# Patient Record
Sex: Female | Born: 1950 | Race: White | Hispanic: Yes | Marital: Married | Smoking: Never smoker
Health system: Southern US, Community
[De-identification: ages and names within clinical notes are randomized; demographics above are authoritative.]

## PROBLEM LIST (undated history)

## (undated) DIAGNOSIS — E785 Hyperlipidemia, unspecified: Secondary | ICD-10-CM

## (undated) DIAGNOSIS — I10 Essential (primary) hypertension: Secondary | ICD-10-CM

## (undated) DIAGNOSIS — E119 Type 2 diabetes mellitus without complications: Secondary | ICD-10-CM

## (undated) HISTORY — DX: Hyperlipidemia, unspecified: E78.5

## (undated) HISTORY — DX: Type 2 diabetes mellitus without complications: E11.9

## (undated) HISTORY — DX: Essential (primary) hypertension: I10

---

## 2001-12-09 ENCOUNTER — Ambulatory Visit (HOSPITAL_COMMUNITY): Admission: RE | Admit: 2001-12-09 | Discharge: 2001-12-09 | Payer: Self-pay | Admitting: Family Medicine

## 2001-12-09 ENCOUNTER — Encounter: Payer: Self-pay | Admitting: Family Medicine

## 2002-08-08 ENCOUNTER — Ambulatory Visit (HOSPITAL_COMMUNITY): Admission: RE | Admit: 2002-08-08 | Discharge: 2002-08-08 | Payer: Self-pay | Admitting: Pediatrics

## 2002-08-08 ENCOUNTER — Encounter: Payer: Self-pay | Admitting: General Surgery

## 2003-02-17 ENCOUNTER — Ambulatory Visit (HOSPITAL_COMMUNITY): Admission: RE | Admit: 2003-02-17 | Discharge: 2003-02-17 | Payer: Self-pay | Admitting: Family Medicine

## 2003-04-11 ENCOUNTER — Other Ambulatory Visit: Admission: RE | Admit: 2003-04-11 | Discharge: 2003-04-11 | Payer: Self-pay

## 2003-04-14 ENCOUNTER — Ambulatory Visit (HOSPITAL_COMMUNITY): Admission: RE | Admit: 2003-04-14 | Discharge: 2003-04-14 | Payer: Self-pay | Admitting: Family Medicine

## 2004-06-27 ENCOUNTER — Ambulatory Visit (HOSPITAL_COMMUNITY): Admission: RE | Admit: 2004-06-27 | Discharge: 2004-06-27 | Payer: Self-pay | Admitting: Family Medicine

## 2004-10-01 ENCOUNTER — Ambulatory Visit (HOSPITAL_COMMUNITY): Admission: RE | Admit: 2004-10-01 | Discharge: 2004-10-01 | Payer: Self-pay | Admitting: General Surgery

## 2004-10-04 ENCOUNTER — Ambulatory Visit (HOSPITAL_COMMUNITY): Admission: RE | Admit: 2004-10-04 | Discharge: 2004-10-04 | Payer: Self-pay | Admitting: General Surgery

## 2005-09-19 ENCOUNTER — Ambulatory Visit (HOSPITAL_COMMUNITY): Admission: RE | Admit: 2005-09-19 | Discharge: 2005-09-19 | Payer: Self-pay | Admitting: Family Medicine

## 2006-10-12 ENCOUNTER — Ambulatory Visit (HOSPITAL_COMMUNITY): Admission: RE | Admit: 2006-10-12 | Discharge: 2006-10-12 | Payer: Self-pay | Admitting: General Surgery

## 2007-10-21 ENCOUNTER — Ambulatory Visit (HOSPITAL_COMMUNITY): Admission: RE | Admit: 2007-10-21 | Discharge: 2007-10-21 | Payer: Self-pay | Admitting: General Surgery

## 2008-04-14 ENCOUNTER — Other Ambulatory Visit: Admission: RE | Admit: 2008-04-14 | Discharge: 2008-04-14 | Payer: Self-pay | Admitting: Obstetrics and Gynecology

## 2013-03-11 ENCOUNTER — Ambulatory Visit (HOSPITAL_COMMUNITY)
Admission: RE | Admit: 2013-03-11 | Discharge: 2013-03-11 | Disposition: A | Discharge status: Home / Self Care | Payer: No Typology Code available for payment source | Source: Ambulatory Visit | Attending: General Surgery | Admitting: General Surgery

## 2013-03-11 ENCOUNTER — Other Ambulatory Visit (HOSPITAL_COMMUNITY): Payer: Self-pay | Admitting: General Surgery

## 2013-03-11 DIAGNOSIS — M542 Cervicalgia: Secondary | ICD-10-CM

## 2013-08-29 ENCOUNTER — Other Ambulatory Visit (HOSPITAL_COMMUNITY): Payer: Self-pay | Admitting: General Surgery

## 2013-08-29 DIAGNOSIS — N644 Mastodynia: Secondary | ICD-10-CM

## 2013-08-31 ENCOUNTER — Ambulatory Visit (HOSPITAL_COMMUNITY)
Admission: RE | Admit: 2013-08-31 | Discharge: 2013-08-31 | Disposition: A | Discharge status: Home / Self Care | Payer: 59 | Source: Ambulatory Visit | Attending: General Surgery | Admitting: General Surgery

## 2013-08-31 DIAGNOSIS — N644 Mastodynia: Secondary | ICD-10-CM

## 2018-05-20 ENCOUNTER — Other Ambulatory Visit (HOSPITAL_COMMUNITY): Payer: Self-pay | Admitting: Family Medicine

## 2018-05-20 DIAGNOSIS — Z1231 Encounter for screening mammogram for malignant neoplasm of breast: Secondary | ICD-10-CM

## 2018-05-24 ENCOUNTER — Encounter (HOSPITAL_COMMUNITY): Payer: Self-pay

## 2018-05-24 ENCOUNTER — Ambulatory Visit (HOSPITAL_COMMUNITY)
Admission: RE | Admit: 2018-05-24 | Discharge: 2018-05-24 | Disposition: A | Discharge status: Home / Self Care | Payer: BLUE CROSS/BLUE SHIELD | Source: Ambulatory Visit | Attending: Family Medicine | Admitting: Family Medicine

## 2018-05-24 DIAGNOSIS — Z1231 Encounter for screening mammogram for malignant neoplasm of breast: Secondary | ICD-10-CM

## 2018-06-09 ENCOUNTER — Other Ambulatory Visit: Payer: Self-pay

## 2018-06-09 ENCOUNTER — Ambulatory Visit: Payer: BLUE CROSS/BLUE SHIELD | Admitting: Obstetrics and Gynecology

## 2018-06-09 ENCOUNTER — Other Ambulatory Visit (HOSPITAL_COMMUNITY)
Admission: RE | Admit: 2018-06-09 | Discharge: 2018-06-09 | Disposition: A | Discharge status: Home / Self Care | Payer: BLUE CROSS/BLUE SHIELD | Source: Ambulatory Visit | Attending: Obstetrics and Gynecology | Admitting: Obstetrics and Gynecology

## 2018-06-09 ENCOUNTER — Encounter: Payer: Self-pay | Admitting: Obstetrics and Gynecology

## 2018-06-09 VITALS — BP 144/71 | HR 75 | Ht 59.0 in | Wt 139.0 lb

## 2018-06-09 DIAGNOSIS — Z01419 Encounter for gynecological examination (general) (routine) without abnormal findings: Secondary | ICD-10-CM | POA: Insufficient documentation

## 2018-06-09 MED ORDER — HYDROCORTISONE 1 % EX OINT: 1.0000 "application " | TOPICAL_OINTMENT | Freq: Two times a day (BID) | CUTANEOUS | 99 refills | Status: DC

## 2018-06-09 MED ORDER — ESTROGENS, CONJUGATED 0.625 MG/GM VA CREA: 1.0000 | TOPICAL_CREAM | VAGINAL | 2 refills | Status: DC

## 2018-06-09 NOTE — Progress Notes (Signed)
Patient ID: Carrie Robertson, female   DOB: 07/17/1950, 67 y.o.   MRN: [id number]  Assessment:  Annual Gyn Exam Atrophic vaginitis Plan:  1. pap smear done, next pap due 3 years 2. Rx Premarin, 2-3 times a week at night/ Hydrocortisone cream 3. Gyn: f/u 7 weeks 3    Annual mammogram advised after age 67 Subjective:  Carrie Robertson is a 67 y.o. female G1P1 who presents for annual exam. No LMP recorded. Patient is postmenopausal. The patient has complaints today of vaginal dryness and itching. She has no other complaints.  The following portions of the patient's history were reviewed and updated as appropriate: allergies, current medications, past family history, past medical history, past social history, past surgical history and problem list. Past Medical History:  Diagnosis Date  . Hypertension     Past Surgical History:  Procedure Laterality Date  . CESAREAN SECTION      No current outpatient medications on file.  Review of Systems Constitutional: negative Gastrointestinal: negative Genitourinary: normal  Objective:  BP (!) 144/71 (BP Location: Right Arm, Patient Position: Sitting, Cuff Size: Normal)   Pulse 75   Ht 4\' 11"  (1.499 m)   Wt 139 lb (63 kg)   BMI 28.07 kg/m    BMI: Body mass index is 28.07 kg/m.  General Appearance: Alert, appropriate appearance for age. No acute distress HEENT: Grossly normal Neck / Thyroid:  Cardiovascular: RRR; normal S1, S2, no murmur Lungs: CTA bilaterally Back: No CVAT Breast Exam: Normal to inspection, Had 3D mammo on 05/24/18 with normal results Gastrointestinal: Soft, non-tender, no masses or organomegaly Pelvic Exam:   EXTERNAL GENITALIA: Moderate irritation and inflammation around clitoris VAGINA: severely atrophic tissues, good vaginal support CERVIX: tiny normal appearing  UTERUS:normal PAP: Pap smear done today. Lymphatic Exam: Non-palpable nodes in neck, clavicular, axillary, or inguinal regions  Skin:  no rash or abnormalities Neurologic: Normal gait and speech, no tremor  Psychiatric: Alert and oriented, appropriate affect.  Urinalysis:Not done  By signing my name below, I, Arnette NorrisMari Johnson, attest that this documentation has been prepared under the direction and in the presence of Tilda BurrowFerguson, Jabrea Kallstrom V, MD. Electronically Signed: Arnette NorrisMari Johnson Medical Scribe. 06/09/18. 8:59 AM.  I personally performed the services described in this documentation, which was SCRIBED in my presence. The recorded information has been reviewed and considered accurate. It has been edited as necessary during review. Tilda BurrowJohn V Keryl Gholson, MD

## 2018-06-09 NOTE — Progress Notes (Signed)
Spanish interpreter [phone] used for interpretation with medical history.   Pt c/o vaginal dryness. Premarin vaginal cream sample given per Dr Emelda FearFerguson Lot [id number] Exp 09/20

## 2018-06-11 LAB — CYTOLOGY - PAP
DIAGNOSIS: NEGATIVE
HPV: NOT DETECTED

## 2018-06-15 ENCOUNTER — Ambulatory Visit (HOSPITAL_COMMUNITY)
Admission: RE | Admit: 2018-06-15 | Discharge: 2018-06-15 | Disposition: A | Discharge status: Home / Self Care | Payer: BLUE CROSS/BLUE SHIELD | Source: Ambulatory Visit | Attending: Family Medicine | Admitting: Family Medicine

## 2018-06-15 ENCOUNTER — Other Ambulatory Visit (HOSPITAL_COMMUNITY): Payer: Self-pay | Admitting: Family Medicine

## 2018-06-15 DIAGNOSIS — M5440 Lumbago with sciatica, unspecified side: Secondary | ICD-10-CM | POA: Diagnosis present

## 2018-07-21 ENCOUNTER — Ambulatory Visit: Payer: BLUE CROSS/BLUE SHIELD | Admitting: Obstetrics and Gynecology

## 2018-09-14 ENCOUNTER — Other Ambulatory Visit (HOSPITAL_COMMUNITY): Payer: Self-pay | Admitting: Family Medicine

## 2018-09-14 DIAGNOSIS — M545 Low back pain, unspecified: Secondary | ICD-10-CM

## 2018-09-17 ENCOUNTER — Ambulatory Visit (HOSPITAL_COMMUNITY)
Admission: RE | Admit: 2018-09-17 | Discharge: 2018-09-17 | Disposition: A | Discharge status: Home / Self Care | Payer: BLUE CROSS/BLUE SHIELD | Source: Ambulatory Visit | Attending: Family Medicine | Admitting: Family Medicine

## 2018-09-17 ENCOUNTER — Other Ambulatory Visit: Payer: Self-pay

## 2018-09-17 DIAGNOSIS — M545 Low back pain, unspecified: Secondary | ICD-10-CM

## 2019-10-21 ENCOUNTER — Encounter: Payer: Self-pay | Admitting: *Deleted

## 2019-12-15 ENCOUNTER — Telehealth: Payer: Self-pay

## 2019-12-15 NOTE — Telephone Encounter (Signed)
Pt has a nurse visit on 01/19/20 and needs to cancel it. Pt is going out of town and will call back to r/s when she returns. Pt isn't sure when she will be back.

## 2019-12-15 NOTE — Telephone Encounter (Signed)
Noted.  Cancelled nurse visit.

## 2020-01-19 ENCOUNTER — Ambulatory Visit: Payer: BLUE CROSS/BLUE SHIELD

## 2020-11-01 IMAGING — MR MRI LUMBAR SPINE WITHOUT CONTRAST
4 of 5 series · 15 of 48 positions shown · non-contrast
Comparison: Lumbar radiography 06/15/2018

CLINICAL DATA: Mid back pain with numbness and tingling mainly on
the right side. Symptoms for 9 years

EXAM:
MRI LUMBAR SPINE WITHOUT CONTRAST
TECHNIQUE: Multiplanar, multisequence MR imaging of the lumbar spine was
performed. No intravenous contrast was administered.

[Series 5: T2 · sagittal · 4.0mm · 0.78mm/px · 6 of 17 slices shown (1 of 2)]
[im 1/17]
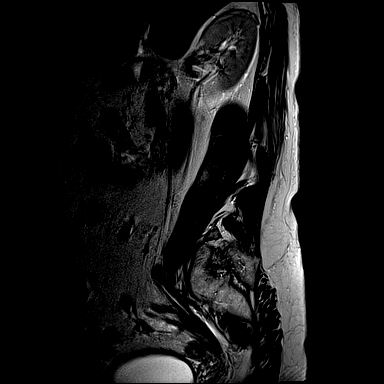
[im 4/17]
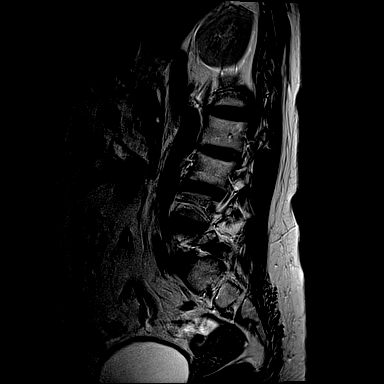
[im 7/17]
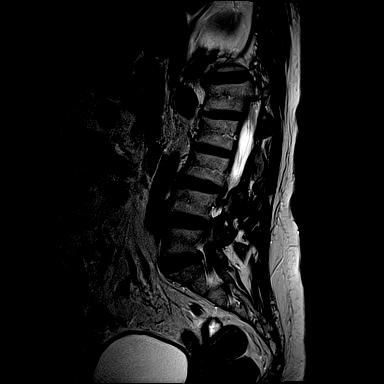
[im 10/17]
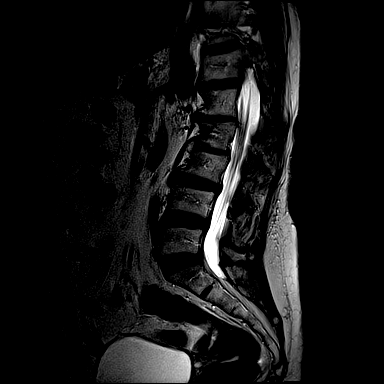
[im 13/17]
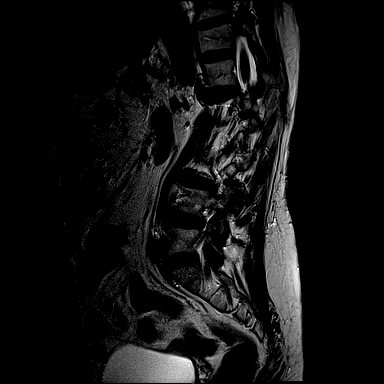
[im 17/17]
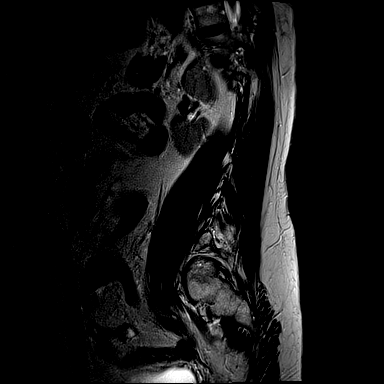

[Series 6: T1 · sagittal · 4.0mm · 0.39mm/px · 3 of 17 slices shown (1 of 2)]
[im 4/17]
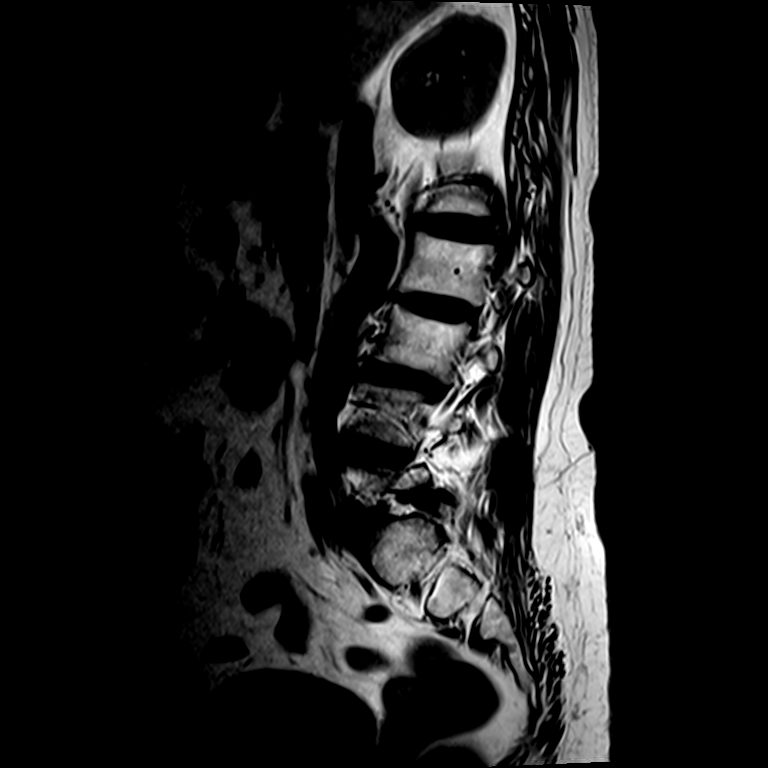
[im 10/17]
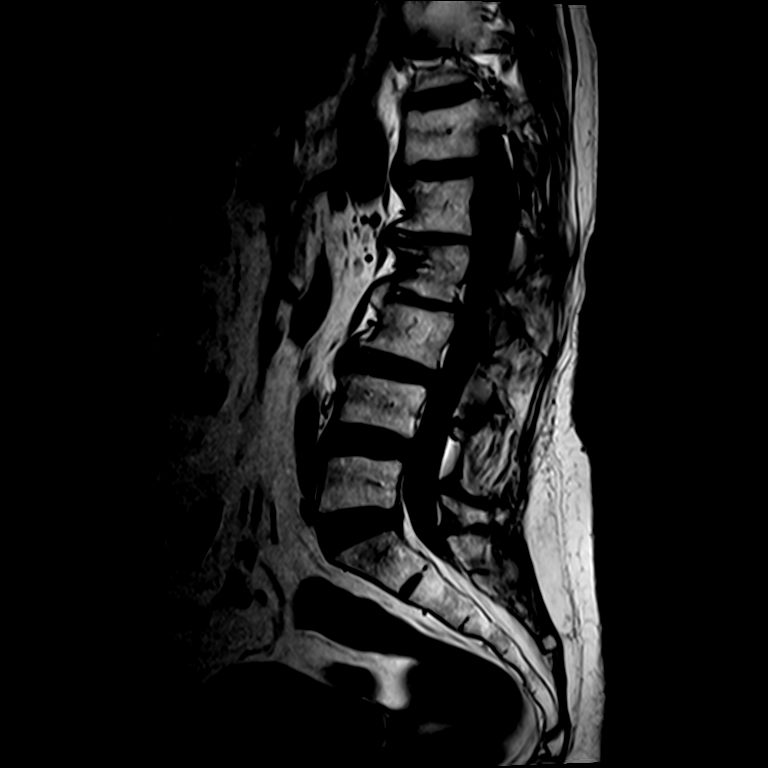
[im 17/17]
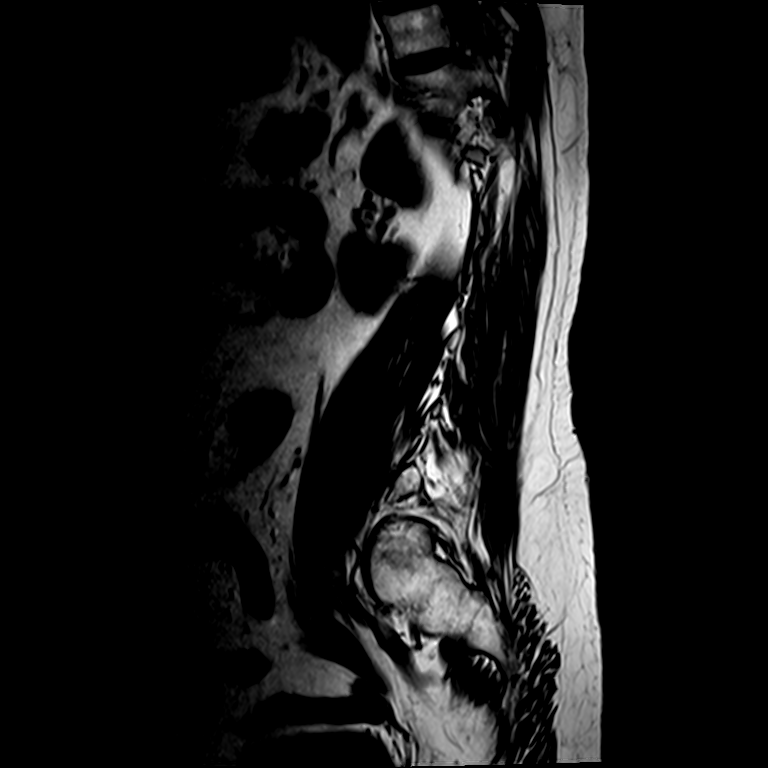

[Series 8: T2 · axial · 4.0mm · 0.26mm/px · z∈[-68,+66]mm · 3 of 42 slices shown (2 of 2)]
[im 6/42]
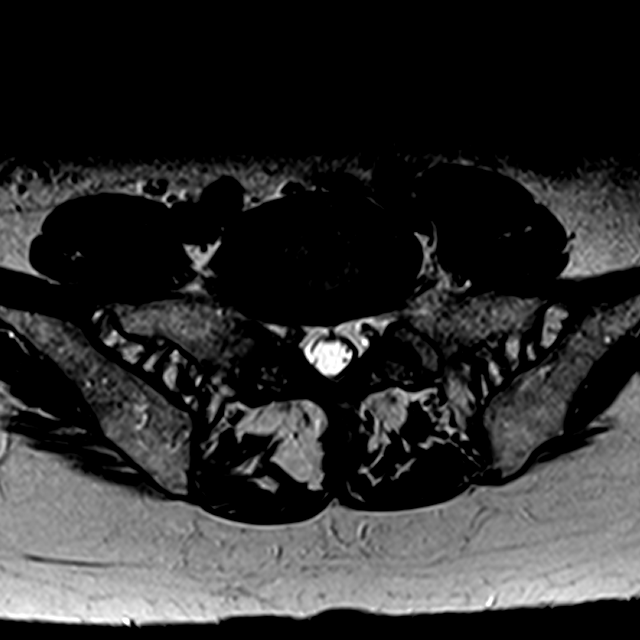
[im 21/42]
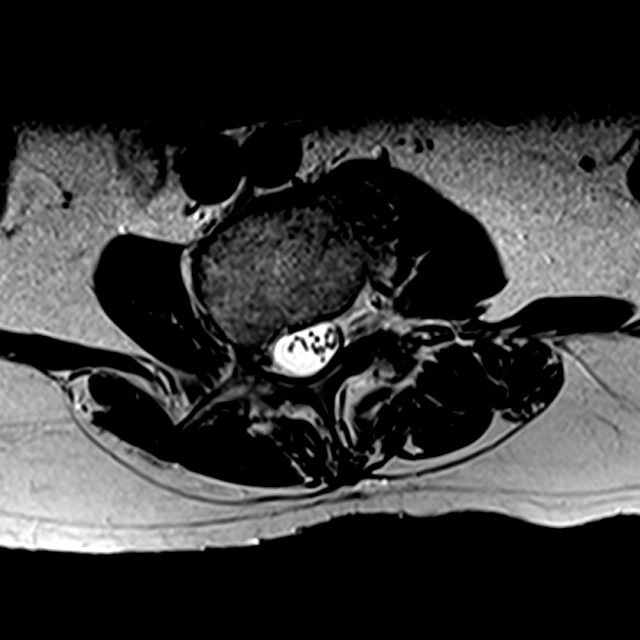
[im 36/42]
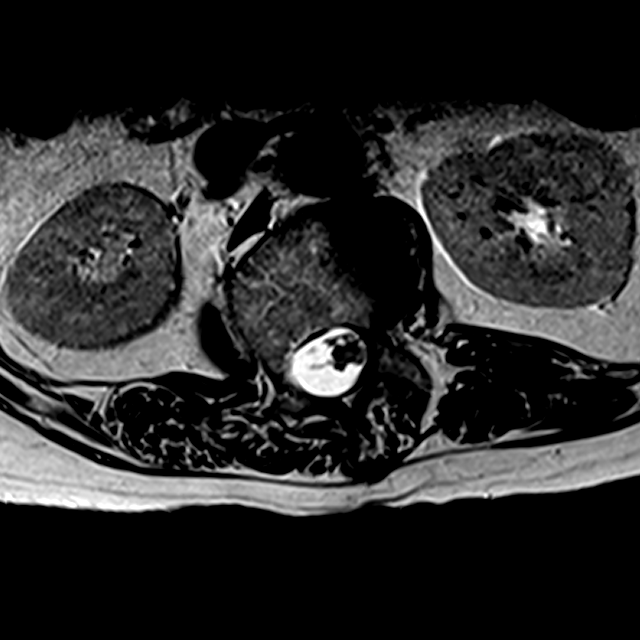

[Series 9: T1 · axial · 4.0mm · 0.26mm/px · z∈[-68,+66]mm · 3 of 42 slices shown (2 of 2)]
[im 6/42]
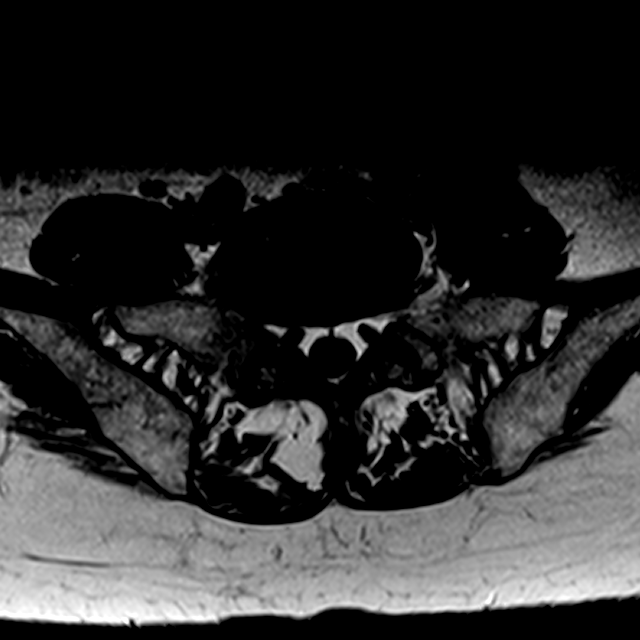
[im 21/42]
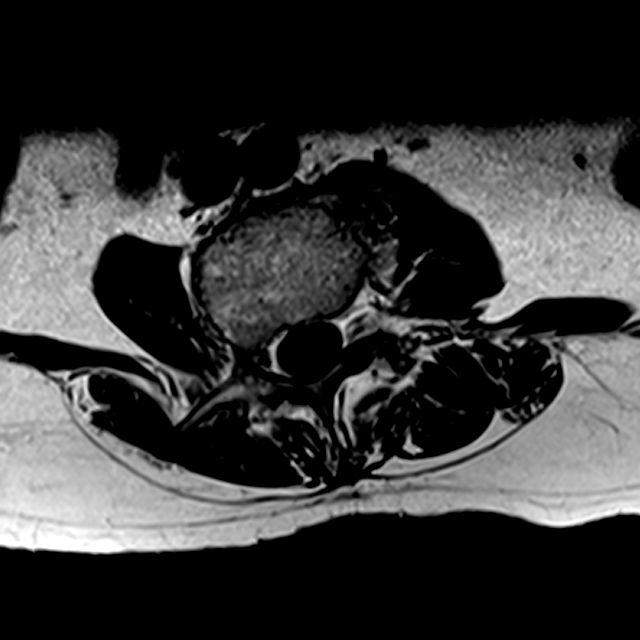
[im 36/42]
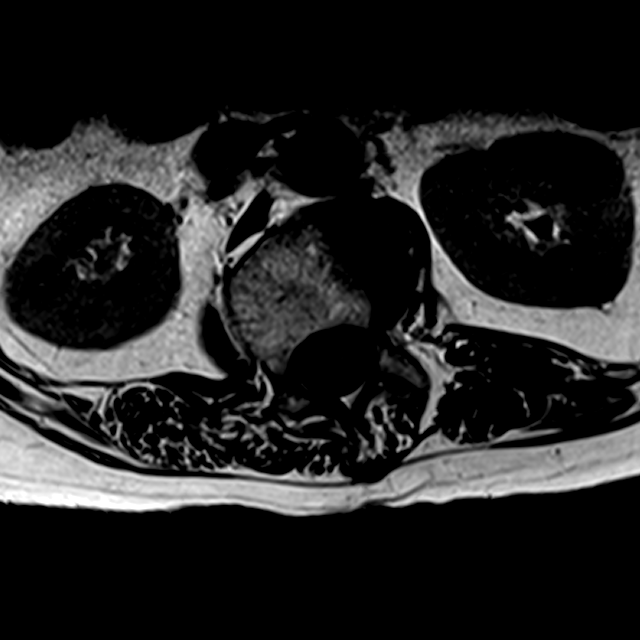

[15 of 48 positions shown; findings below may reference images not displayed]

FINDINGS: Segmentation:  5 lumbar vertebrae based on lumbar radiography

Alignment:  Dextroscoliosis centered at L1-2

Vertebrae:  No fracture, evidence of discitis, or bone lesion.

Conus medullaris and cauda equina: Conus extends to the L1-2 level.
Conus and cauda equina appear normal.

Paraspinal and other soft tissues: Negative

Disc levels:

T12- L1: Minor disc bulging

L1-L2: Asymmetric leftward disc narrowing.  No impingement

L2-L3: Asymmetric leftward disc narrowing.  No impingement

L3-L4: Mild disc bulging and facet spurring.  No impingement

L4-L5: Disc bulging with right subarticular recess stenosis that
could affect the L5 nerve root. Mild foraminal narrowing on the
right

L5-S1:Rightward disc bulging and mild facet spurring. Right
foraminal narrowing that is mild to moderate.
IMPRESSION: 1. Scoliosis with asymmetric disc narrowing and bulging.
2. L4-5 right subarticular recess stenosis that could affect the L5
nerve root.
3. L5-S1 mild borderline moderate right foraminal narrowing.

## 2022-12-10 ENCOUNTER — Ambulatory Visit: Payer: Medicare Other | Admitting: Family Medicine

## 2022-12-10 ENCOUNTER — Encounter: Payer: Self-pay | Admitting: Family Medicine

## 2022-12-10 ENCOUNTER — Encounter: Payer: Self-pay | Admitting: *Deleted

## 2022-12-10 VITALS — BP 174/94 | HR 74 | Ht 61.0 in | Wt 144.0 lb

## 2022-12-10 DIAGNOSIS — Z1329 Encounter for screening for other suspected endocrine disorder: Secondary | ICD-10-CM

## 2022-12-10 DIAGNOSIS — I10 Essential (primary) hypertension: Secondary | ICD-10-CM | POA: Insufficient documentation

## 2022-12-10 DIAGNOSIS — E559 Vitamin D deficiency, unspecified: Secondary | ICD-10-CM

## 2022-12-10 DIAGNOSIS — Z1159 Encounter for screening for other viral diseases: Secondary | ICD-10-CM

## 2022-12-10 DIAGNOSIS — E538 Deficiency of other specified B group vitamins: Secondary | ICD-10-CM

## 2022-12-10 DIAGNOSIS — Z131 Encounter for screening for diabetes mellitus: Secondary | ICD-10-CM

## 2022-12-10 DIAGNOSIS — Z13220 Encounter for screening for lipoid disorders: Secondary | ICD-10-CM

## 2022-12-10 DIAGNOSIS — Z1211 Encounter for screening for malignant neoplasm of colon: Secondary | ICD-10-CM

## 2022-12-10 MED ORDER — OLMESARTAN MEDOXOMIL-HCTZ 40-12.5 MG PO TABS: 1.0000 | ORAL_TABLET | Freq: Every day | ORAL | 1 refills | Status: DC

## 2022-12-10 NOTE — Progress Notes (Signed)
New Patient Office Visit   Subjective   Patient ID: Carrie Robertson, female    DOB: 1951-02-08  Age: 72 y.o. MRN: [id number]  CC:  Chief Complaint  Patient presents with   Establish Care    Patient is here to establish care.     HPI Carrie Robertson 72 year old female,  presents to establish care. She  has a past medical history of Hypertension.  Hypertension This is a chronic problem. Blood pressure has been gradually worsening since onset. Blood pressure is uncontrolled in today's visit. Pertinent negatives include no blurred vision, chest pain, headaches, palpitations or peripheral edema. Risk factors for coronary artery disease include stress and family history. Past treatments include angiotensin blockers. The current treatment provides mild improvement. Compliance problems include diet.         Outpatient Encounter Medications as of 12/10/2022  Medication Sig   olmesartan-hydrochlorothiazide (BENICAR HCT) 40-12.5 MG tablet Take 1 tablet by mouth daily.   [DISCONTINUED] olmesartan (BENICAR) 20 MG tablet Take 20 mg by mouth daily.   conjugated estrogens (PREMARIN) vaginal cream Place 1 Applicatorful vaginally 3 (three) times a week. For vaginal thinning and irritation (Patient not taking: Reported on 12/10/2022)   hydrocortisone 1 % ointment Apply 1 application topically 2 (two) times daily. (Patient not taking: Reported on 12/10/2022)   No facility-administered encounter medications on file as of 12/10/2022.    Past Surgical History:  Procedure Laterality Date   CESAREAN SECTION      Review of Systems  Constitutional:  Negative for fever.  Respiratory:  Negative for cough.   Gastrointestinal:  Negative for abdominal pain and vomiting.  Genitourinary:  Negative for dysuria.  Neurological:  Negative for dizziness.      Objective    BP (!) 174/94   Pulse 74   Ht 5\' 1"  (1.549 m)   Wt 144 lb (65.3 kg)   SpO2 95%   BMI 27.21 kg/m   Physical Exam Vitals  reviewed.  Constitutional:      General: She is not in acute distress.    Appearance: Normal appearance. She is not ill-appearing, toxic-appearing or diaphoretic.  HENT:     Head: Normocephalic.  Eyes:     General:        Right eye: No discharge.        Left eye: No discharge.     Conjunctiva/sclera: Conjunctivae normal.  Cardiovascular:     Rate and Rhythm: Normal rate.     Pulses: Normal pulses.     Heart sounds: Normal heart sounds.  Pulmonary:     Effort: Pulmonary effort is normal. No respiratory distress.     Breath sounds: Normal breath sounds.  Abdominal:     General: Bowel sounds are normal.     Palpations: Abdomen is soft.     Tenderness: There is no abdominal tenderness. There is no right CVA tenderness, left CVA tenderness or guarding.  Musculoskeletal:        General: Normal range of motion.     Cervical back: Normal range of motion.  Skin:    General: Skin is warm and dry.     Capillary Refill: Capillary refill takes less than 2 seconds.  Neurological:     General: No focal deficit present.     Mental Status: She is alert and oriented to person, place, and time.     Coordination: Coordination normal.     Gait: Gait normal.  Psychiatric:  Mood and Affect: Mood normal.        Behavior: Behavior normal.       Assessment & Plan:  Primary hypertension Assessment & Plan: Vitals:   12/10/22 1043  BP: (!) 174/94   Blood pressure not controlled in today's visit Increases Olmesaratn-Hydrochlorothiazide 40-12 mg daily Follow up in 2 weeks            Explained non pharmacological interventions such as low salt, DASH diet discussed. Educated on stress reduction and physical activity minimum 150 minutes per week. Discussed signs and symptoms of major cardiovascular event and need to present to the ED. Patient verbalizes understanding regarding plan of care and all questions answered.   Orders: -     CBC with Differential/Platelet -     CMP14+EGFR -      Microalbumin / creatinine urine ratio  Screening for colon cancer -     Ambulatory referral to Gastroenterology  Need for hepatitis C screening test -     Hepatitis C antibody  Vitamin D deficiency -     VITAMIN D 25 Hydroxy (Vit-D Deficiency, Fractures)  Screening for thyroid disorder -     TSH + free T4  Screening for diabetes mellitus -     Hemoglobin A1c  Screening for lipid disorders -     Lipid panel  Vitamin B 12 deficiency -     Vitamin B12  Other orders -     Olmesartan Medoxomil-HCTZ; Take 1 tablet by mouth daily.  Dispense: 30 tablet; Refill: 1    Return in about 2 weeks (around 12/24/2022) for hypertension, re-check blood pressure, Back pain.   Cruzita Lederer Newman Nip, FNP

## 2022-12-10 NOTE — Patient Instructions (Signed)

## 2022-12-10 NOTE — Assessment & Plan Note (Signed)
Vitals:   12/10/22 1043  BP: (!) 174/94   Blood pressure not controlled in today's visit Increases Olmesaratn-Hydrochlorothiazide 40-12 mg daily Follow up in 2 weeks            Explained non pharmacological interventions such as low salt, DASH diet discussed. Educated on stress reduction and physical activity minimum 150 minutes per week. Discussed signs and symptoms of major cardiovascular event and need to present to the ED. Patient verbalizes understanding regarding plan of care and all questions answered.

## 2022-12-12 LAB — LIPID PANEL
Chol/HDL Ratio: 4.8 ratio — ABNORMAL HIGH (ref 0.0–4.4)
Triglycerides: 184 mg/dL — ABNORMAL HIGH (ref 0–149)

## 2022-12-12 LAB — CBC WITH DIFFERENTIAL/PLATELET
Basophils Absolute: 0 10*3/uL (ref 0.0–0.2)
Hemoglobin: 14.5 g/dL (ref 11.1–15.9)
Immature Grans (Abs): 0 10*3/uL (ref 0.0–0.1)
Lymphs: 48 %
MCH: 29.8 pg (ref 26.6–33.0)
Monocytes: 7 %
Neutrophils Absolute: 3.8 10*3/uL (ref 1.4–7.0)
RBC: 4.86 x10E6/uL (ref 3.77–5.28)
RDW: 11.7 % (ref 11.7–15.4)
WBC: 8.5 10*3/uL (ref 3.4–10.8)

## 2022-12-12 LAB — CMP14+EGFR
ALT: 34 IU/L — ABNORMAL HIGH (ref 0–32)
Albumin/Globulin Ratio: 1.7
Alkaline Phosphatase: 119 IU/L (ref 44–121)
BUN/Creatinine Ratio: 27 (ref 12–28)
BUN: 20 mg/dL (ref 8–27)
Calcium: 9.9 mg/dL (ref 8.7–10.3)
Creatinine, Ser: 0.75 mg/dL (ref 0.57–1.00)
Globulin, Total: 2.7 g/dL (ref 1.5–4.5)
Potassium: 4.7 mmol/L (ref 3.5–5.2)

## 2022-12-12 LAB — TSH+FREE T4
Free T4: 1.22 ng/dL (ref 0.82–1.77)
TSH: 2.37 u[IU]/mL (ref 0.450–4.500)

## 2022-12-12 LAB — HEPATITIS C ANTIBODY: Hep C Virus Ab: NONREACTIVE

## 2022-12-12 LAB — MICROALBUMIN / CREATININE URINE RATIO

## 2022-12-13 LAB — LIPID PANEL
Cholesterol, Total: 167 mg/dL (ref 100–199)
HDL: 35 mg/dL — ABNORMAL LOW (ref >39)
LDL Chol Calc (NIH): 100 mg/dL — ABNORMAL HIGH (ref 0–99)
VLDL Cholesterol Cal: 32 mg/dL (ref 5–40)

## 2022-12-13 LAB — HEMOGLOBIN A1C
Est. average glucose Bld gHb Est-mCnc: 140 mg/dL
Hgb A1c MFr Bld: 6.5 % — ABNORMAL HIGH (ref 4.8–5.6)

## 2022-12-13 LAB — CBC WITH DIFFERENTIAL/PLATELET
Basos: 0 %
EOS (ABSOLUTE): 0.1 10*3/uL (ref 0.0–0.4)
Eos: 1 %
Hematocrit: 44.7 % (ref 34.0–46.6)
Immature Granulocytes: 0 %
Lymphocytes Absolute: 4 10*3/uL — ABNORMAL HIGH (ref 0.7–3.1)
MCHC: 32.4 g/dL (ref 31.5–35.7)
MCV: 92 fL (ref 79–97)
Monocytes Absolute: 0.6 10*3/uL (ref 0.1–0.9)
Neutrophils: 44 %
Platelets: 280 10*3/uL (ref 150–450)

## 2022-12-13 LAB — CMP14+EGFR
AST: 33 IU/L (ref 0–40)
Albumin: 4.5 g/dL (ref 3.8–4.8)
Bilirubin Total: 0.3 mg/dL (ref 0.0–1.2)
CO2: 25 mmol/L (ref 20–29)
Chloride: 102 mmol/L (ref 96–106)
Glucose: 107 mg/dL — ABNORMAL HIGH (ref 70–99)
Sodium: 141 mmol/L (ref 134–144)
Total Protein: 7.2 g/dL (ref 6.0–8.5)
eGFR: 85 mL/min/{1.73_m2} (ref >59)

## 2022-12-13 LAB — VITAMIN D 25 HYDROXY (VIT D DEFICIENCY, FRACTURES): Vit D, 25-Hydroxy: 31.7 ng/mL (ref 30.0–100.0)

## 2022-12-13 LAB — MICROALBUMIN / CREATININE URINE RATIO
Creatinine, Urine: 116.2 mg/dL
Microalbumin, Urine: 5.4 ug/mL

## 2022-12-13 LAB — VITAMIN B12: Vitamin B-12: 464 pg/mL (ref 232–1245)

## 2022-12-20 ENCOUNTER — Other Ambulatory Visit: Payer: Self-pay | Admitting: Family Medicine

## 2022-12-20 MED ORDER — METFORMIN HCL 500 MG PO TABS: 500.0000 mg | ORAL_TABLET | Freq: Every day | ORAL | 3 refills | Status: DC

## 2022-12-20 MED ORDER — ROSUVASTATIN CALCIUM 10 MG PO TABS: 10.0000 mg | ORAL_TABLET | Freq: Every day | ORAL | 3 refills | Status: DC

## 2022-12-20 NOTE — Progress Notes (Signed)
Hello Carrie Robertson,  Spanish speaking patient,  Please inform patient,  Hemoglobin A1c 6. 5 indicates type 2 diabetes  Plan of treatment will include metformin 500 mg daily- medication sent to pharmacy It is important to follow a DASH diet which includes vegetables,fruits,whole grains, fat free or low fat diary,fish,poultry,beans,nuts and seeds,vegetable oils. Find an activity that you will enjoyandstart to be active at least 5 days a week for 30 minutes each day.     Cholesterol levels elevated, start lifestyle modifications follow diet low in saturated fat, reduce dietary salt intake, avoid fatty foods. Plan of treatment will include Crestor 10 mg to be taken daily- Medication sent to pharmacy

## 2022-12-24 ENCOUNTER — Encounter: Payer: Self-pay | Admitting: Family Medicine

## 2022-12-24 ENCOUNTER — Ambulatory Visit (INDEPENDENT_AMBULATORY_CARE_PROVIDER_SITE_OTHER): Payer: Medicare Other | Admitting: Family Medicine

## 2022-12-24 VITALS — BP 138/68 | HR 78 | Ht 61.0 in | Wt 144.0 lb

## 2022-12-24 DIAGNOSIS — I10 Essential (primary) hypertension: Secondary | ICD-10-CM

## 2022-12-24 DIAGNOSIS — M545 Low back pain, unspecified: Secondary | ICD-10-CM | POA: Diagnosis not present

## 2022-12-24 MED ORDER — METFORMIN HCL 500 MG PO TABS: 500.0000 mg | ORAL_TABLET | Freq: Every day | ORAL | 1 refills | Status: DC

## 2022-12-24 MED ORDER — OLMESARTAN MEDOXOMIL-HCTZ 40-25 MG PO TABS: 1.0000 | ORAL_TABLET | Freq: Every day | ORAL | 1 refills | Status: DC

## 2022-12-24 NOTE — Assessment & Plan Note (Signed)
Vitals:   12/24/22 0925 12/24/22 0946  BP: (!) 165/68 138/68   Increased Olmesartan-hydrochlorothiazide 40-25 MG Continued discussion on DASH diet, low sodium diet and maintain a exercise routine for 150 minutes per week.

## 2022-12-24 NOTE — Patient Instructions (Signed)

## 2022-12-24 NOTE — Progress Notes (Signed)
Patient Office Visit   Subjective   Patient ID: Carrie Robertson, female    DOB: 1950-10-30  Age: 72 y.o. MRN: [id number]  CC:  Chief Complaint  Patient presents with   Hypertension    Follow up for bp recheck   Back Pain    Following up for back pain pt reports back pain is still present with daily activities, also has shoulder pain from many years ago on right shoulder.    Results    Reviewed lab results with patient she would like further information on dm2.    HPI Carrie Robertson 72 year old female presents to the clinic for chronic back pain and HTN follow up. She  has a past medical history of Hypertension.  Back Pain This is a chronic problem. The problem occurs intermittently. The problem has been waxing and waning since onset. The pain is present in the lumbar spine. The pain does not radiate. The pain is at a severity of 8/10. The pain is The same all the time. The symptoms are aggravated by lying down, position, sitting, twisting and bending. Associated symptoms include numbness. Pertinent negatives include no bladder incontinence, bowel incontinence, chest pain, headaches, leg pain, pelvic pain or perianal numbness. Risk factors include poor posture. She has tried ice and bed rest for the symptoms. The treatment provided no relief.      Outpatient Encounter Medications as of 12/24/2022  Medication Sig   conjugated estrogens (PREMARIN) vaginal cream Place 1 Applicatorful vaginally 3 (three) times a week. For vaginal thinning and irritation   hydrocortisone 1 % ointment Apply 1 application topically 2 (two) times daily.   olmesartan-hydrochlorothiazide (BENICAR HCT) 40-25 MG tablet Take 1 tablet by mouth daily.   rosuvastatin (CRESTOR) 10 MG tablet Take 1 tablet (10 mg total) by mouth daily.   [DISCONTINUED] metFORMIN (GLUCOPHAGE) 500 MG tablet Take 1 tablet (500 mg total) by mouth daily with breakfast.   [DISCONTINUED] olmesartan-hydrochlorothiazide (BENICAR HCT)  40-12.5 MG tablet Take 1 tablet by mouth daily.   metFORMIN (GLUCOPHAGE) 500 MG tablet Take 1 tablet (500 mg total) by mouth daily with breakfast.   No facility-administered encounter medications on file as of 12/24/2022.    Past Surgical History:  Procedure Laterality Date   CESAREAN SECTION      Review of Systems  Eyes:  Negative for blurred vision.  Respiratory:  Negative for shortness of breath.   Cardiovascular:  Negative for chest pain.  Gastrointestinal:  Negative for bowel incontinence.  Genitourinary:  Negative for bladder incontinence and pelvic pain.  Musculoskeletal:  Positive for back pain, joint pain and myalgias.  Neurological:  Positive for numbness. Negative for dizziness and headaches.      Objective    BP 138/68 (BP Location: Left Arm)   Pulse 78   Ht 5\' 1"  (1.549 m)   Wt 144 lb 0.6 oz (65.3 kg)   SpO2 96%   BMI 27.22 kg/m   Physical Exam Vitals reviewed.  Constitutional:      General: She is not in acute distress.    Appearance: Normal appearance. She is not ill-appearing, toxic-appearing or diaphoretic.  HENT:     Head: Normocephalic.  Eyes:     General:        Right eye: No discharge.        Left eye: No discharge.     Conjunctiva/sclera: Conjunctivae normal.  Cardiovascular:     Rate and Rhythm: Normal rate.     Pulses: Normal pulses.  Heart sounds: Normal heart sounds.  Pulmonary:     Effort: Pulmonary effort is normal. No respiratory distress.     Breath sounds: Normal breath sounds.  Musculoskeletal:     Cervical back: Normal range of motion.     Lumbar back: Decreased range of motion. Positive right straight leg raise test. Negative left straight leg raise test.  Skin:    General: Skin is warm and dry.     Capillary Refill: Capillary refill takes less than 2 seconds.  Neurological:     General: No focal deficit present.     Mental Status: She is alert and oriented to person, place, and time.     Coordination: Coordination normal.      Gait: Gait normal.  Psychiatric:        Mood and Affect: Mood normal.        Behavior: Behavior normal.       Assessment & Plan:  Lumbar pain Assessment & Plan: Referral to physical therapy and orthopedics Discussed medication desired effects, potential side effects. Non pharmacological interventions include rest, avoid twisting, improper bending, straining lower back. Demonstration of proper body mechanics. Alternate ice and heat. Recommend stretching back and legs. Follow up for worsening or persistent symptoms. Patient verbalizes understanding regarding plan of care and all questions answered.   Orders: -     Ambulatory referral to Physical Therapy -     Ambulatory referral to Orthopedics  Primary hypertension Assessment & Plan: Vitals:   12/24/22 0925 12/24/22 0946  BP: (!) 165/68 138/68   Increased Olmesartan-hydrochlorothiazide 40-25 MG Continued discussion on DASH diet, low sodium diet and maintain a exercise routine for 150 minutes per week.   Orders: -     Olmesartan Medoxomil-HCTZ; Take 1 tablet by mouth daily.  Dispense: 30 tablet; Refill: 1  Other orders -     metFORMIN HCl; Take 1 tablet (500 mg total) by mouth daily with breakfast.  Dispense: 90 tablet; Refill: 1    Return in about 4 weeks (around 01/21/2023) for hypertension.   Cruzita Lederer Newman Nip, FNP

## 2022-12-24 NOTE — Assessment & Plan Note (Signed)
Referral to physical therapy and orthopedics Discussed medication desired effects, potential side effects. Non pharmacological interventions include rest, avoid twisting, improper bending, straining lower back. Demonstration of proper body mechanics. Alternate ice and heat. Recommend stretching back and legs. Follow up for worsening or persistent symptoms. Patient verbalizes understanding regarding plan of care and all questions answered.

## 2022-12-25 ENCOUNTER — Ambulatory Visit (INDEPENDENT_AMBULATORY_CARE_PROVIDER_SITE_OTHER): Payer: Medicare Other | Admitting: Orthopaedic Surgery

## 2022-12-25 ENCOUNTER — Other Ambulatory Visit: Payer: Self-pay

## 2022-12-25 ENCOUNTER — Encounter: Payer: Self-pay | Admitting: Orthopaedic Surgery

## 2022-12-25 VITALS — Ht 61.0 in | Wt 144.0 lb

## 2022-12-25 DIAGNOSIS — G8929 Other chronic pain: Secondary | ICD-10-CM

## 2022-12-25 DIAGNOSIS — M25511 Pain in right shoulder: Secondary | ICD-10-CM

## 2022-12-25 DIAGNOSIS — M545 Low back pain, unspecified: Secondary | ICD-10-CM

## 2022-12-25 NOTE — Progress Notes (Signed)
Office Visit Note   Patient: Carrie Robertson           Date of Birth: [birth year]           MRN: [id number] Visit Date: 12/25/2022              Requested by: Rica Records, FNP [phone] [address] [place],  [place] [address] PCP: Rica Records, FNP   Assessment & Plan: Visit Diagnoses:  1. Chronic right shoulder pain   2. Chronic bilateral low back pain, unspecified whether sciatica present     Plan: Shoulder injection performed with good relief.  We spent time reviewing her previous T-spine MRI which showed some small disc protrusions without cord compression.  She has some scoliosis with L4-5 subarticular recess narrowing on the right by MRI 2020.  Borderline foraminal narrowing L5-S1 on the right also.  She noticed significant improvement with shoulder injection she can follow-up later on.  We discussed strengthening core strengthening and stretching exercises for her back.  Follow-Up Instructions: No follow-ups on file.   Orders:  Orders Placed This Encounter  Procedures   XR Lumbar Spine 2-3 Views   XR Shoulder Right   No orders of the defined types were placed in this encounter.     Procedures: Large Joint Inj: R subacromial bursa on 12/25/2022 4:14 PM Indications: pain Details: 22 G 1.5 in needle, lateral approach  Arthrogram: No  Medications: 4 mL bupivacaine 0.25 %; 40 mg methylPREDNISolone acetate 40 MG/ML; 0.5 mL lidocaine 1 % Outcome: tolerated well, no immediate complications Procedure, treatment alternatives, risks and benefits explained, specific risks discussed. Consent was given by the patient. Immediately prior to procedure a time out was called to verify the correct patient, procedure, equipment, support staff and site/side marked as required. Patient was prepped and draped in the usual sterile fashion.       Clinical Data: No additional findings.   Subjective: Chief Complaint  Patient presents with   Right Shoulder -  Pain   Lower Back - Pain    HPI 72 year old new patient visit with mid back pain just below the bra strap and lumbar pain since 2020.  She has had pain in the right shoulder severe for 3 weeks with difficulty moving her arm pain with abduction.  Patient states she was coming down steps slipped face first with outstretched arm back and 2002.  She states she has some back pain when she stands a long time or when washing dishes.  She gets relief from the spine position she is right-hand dominant.  More shoulder pain currently than back pain.  Patient does have diabetes on Glucophage orally also hypertension Benicar HCTZ and Crestor for cholesterol.  No previous shoulder surgery.  She denies associated neck pain.  Previous thoracic and lumbar MRIs March 2020 reviewed today.  She had normal cervical spine imaging x-rays in 2014.  Review of Systems negative for chills or fever numbness tingling in her hands positive for right shoulder weakness abduction.  All other systems updated other than as mentioned above -14 point.   Objective: Vital Signs: Ht 5\' 1"  (1.549 m)   Wt 144 lb (65.3 kg)   BMI 27.21 kg/m   Physical Exam Constitutional:      Appearance: She is well-developed.  HENT:     Head: Normocephalic.     Right Ear: External ear normal.     Left Ear: External ear normal. There is no impacted cerumen.  Eyes:     Pupils: Pupils are equal, round, and reactive to light.  Neck:     Thyroid: No thyromegaly.     Trachea: No tracheal deviation.  Cardiovascular:     Rate and Rhythm: Normal rate.  Pulmonary:     Effort: Pulmonary effort is normal.  Abdominal:     Palpations: Abdomen is soft.  Musculoskeletal:     Cervical back: No rigidity.  Skin:    General: Skin is warm and dry.  Neurological:     Mental Status: She is alert and oriented to person, place, and time.  Psychiatric:        Behavior: Behavior normal.     Ortho Exam positive pendulum right shoulder.  Mild to moderate  tenderness long head of the biceps no distal biceps migration.  Upper extremity reflexes 2+ no brachial plexus tenderness negative Spurling.  Positive Neer negative Hawkins.  Negative Yergason.  No shoulder subluxation.  Specialty Comments:  No specialty comments available.  Imaging: No results found.   PMFS History: Patient Active Problem List   Diagnosis Date Noted   Lumbar pain 12/24/2022   Hypertension 12/10/2022   Past Medical History:  Diagnosis Date   Hypertension     Family History  Problem Relation Age of Onset   Parkinson's disease Father    Osteoporosis Mother    Cancer Brother     Past Surgical History:  Procedure Laterality Date   CESAREAN SECTION     Social History   Occupational History   Not on file  Tobacco Use   Smoking status: Never   Smokeless tobacco: Never  Vaping Use   Vaping Use: Never used  Substance and Sexual Activity   Alcohol use: Not Currently   Drug use: Never   Sexual activity: Not Currently    Birth control/protection: Post-menopausal

## 2022-12-29 MED ORDER — LIDOCAINE HCL 1 % IJ SOLN
0.5000 mL | INTRAMUSCULAR | Status: AC | PRN
Start: 2022-12-25 — End: 2022-12-25
  Administered 2022-12-25: .5 mL

## 2022-12-29 MED ORDER — BUPIVACAINE HCL 0.25 % IJ SOLN
4.0000 mL | INTRAMUSCULAR | Status: AC | PRN
Start: 2022-12-25 — End: 2022-12-25
  Administered 2022-12-25: 4 mL via INTRA_ARTICULAR

## 2022-12-29 MED ORDER — METHYLPREDNISOLONE ACETATE 40 MG/ML IJ SUSP
40.0000 mg | INTRAMUSCULAR | Status: AC | PRN
Start: 2022-12-25 — End: 2022-12-25
  Administered 2022-12-25: 40 mg via INTRA_ARTICULAR

## 2023-01-05 ENCOUNTER — Telehealth: Payer: Self-pay | Admitting: Internal Medicine

## 2023-01-05 NOTE — Telephone Encounter (Signed)
Questionnaire received and is in review

## 2023-01-12 ENCOUNTER — Telehealth: Payer: Self-pay | Admitting: *Deleted

## 2023-01-12 NOTE — Telephone Encounter (Signed)
  Procedure: colonoscopy  Height: 5'1" Weight: 144 lb      BMI: 27.2  Have you had a colonoscopy before?  colonoscopy  Do you have family history of colon cancer?  no  Do you have a family history of polyps? no  Previous colonoscopy with polyps removed? no  Do you have a history colorectal cancer?   no  Are you diabetic?  no  Do you have a prosthetic or mechanical heart valve? no  Do you have a pacemaker/defibrillator?   no  Have you had endocarditis/atrial fibrillation?  no  Do you use supplemental oxygen/CPAP?  no  Have you had joint replacement within the last 12 months?  no  Do you tend to be constipated or have to use laxatives?  no   Do you have history of alcohol use? If yes, how much and how often.  no  Do you have history or are you using drugs? If yes, what do are you  using?  no  Have you ever had a stroke/heart attack?  no  Have you ever had a heart or other vascular stent placed,?no  Do you take weight loss medication? no  female patients,: have you had a hysterectomy? no                              are you post menopausal?  no                              do you still have your menstrual cycle? no    Date of last menstrual period? 01/07/2002  Do you take any blood-thinning medications such as: (Plavix, aspirin, Coumadin, Aggrenox, Brilinta, Xarelto, Eliquis, Pradaxa, Savaysa or Effient)? no  If yes we need the name, milligram, dosage and who is prescribing doctor:  n/a             Current Outpatient Medications  Medication Sig Dispense Refill   metFORMIN (GLUCOPHAGE) 500 MG tablet Take 1 tablet (500 mg total) by mouth daily with breakfast. 90 tablet 1   olmesartan-hydrochlorothiazide (BENICAR HCT) 40-25 MG tablet Take 1 tablet by mouth daily. 30 tablet 1   rosuvastatin (CRESTOR) 10 MG tablet Take 1 tablet (10 mg total) by mouth daily. 90 tablet 3   No current facility-administered medications for this visit.    No Known Allergies

## 2023-01-21 ENCOUNTER — Encounter: Payer: Self-pay | Admitting: Family Medicine

## 2023-01-21 ENCOUNTER — Ambulatory Visit (INDEPENDENT_AMBULATORY_CARE_PROVIDER_SITE_OTHER): Payer: Medicare Other | Admitting: Family Medicine

## 2023-01-21 VITALS — BP 130/79 | HR 86 | Ht 61.0 in | Wt 142.0 lb

## 2023-01-21 DIAGNOSIS — I10 Essential (primary) hypertension: Secondary | ICD-10-CM | POA: Diagnosis not present

## 2023-01-21 DIAGNOSIS — Z13820 Encounter for screening for osteoporosis: Secondary | ICD-10-CM | POA: Diagnosis not present

## 2023-01-21 MED ORDER — OLMESARTAN MEDOXOMIL-HCTZ 40-12.5 MG PO TABS: 1.0000 | ORAL_TABLET | Freq: Every day | ORAL | 1 refills | Status: DC

## 2023-01-21 NOTE — Assessment & Plan Note (Signed)
Vitals:   01/21/23 1000 01/21/23 1005 01/21/23 1040  BP: (!) 172/76 (!) 152/78 130/79  Blood pressure controlled at home with readings ranging from: 130/73, 115/66, 110/63, 113/69, 102/62 Refilled Olmesartan-hydrochlorothiazide 40-12mg  once daily Continued discussion on DASH diet, low sodium diet and maintain a exercise routine for 150 minutes per week.

## 2023-01-21 NOTE — Progress Notes (Signed)
Patient Office Visit   Subjective   Patient ID: Carrie Robertson, female    DOB: 02/03/1951  Age: 72 y.o. MRN: [id number]  CC:  Chief Complaint  Patient presents with   Hypertension    Patient is here for f/u of HTN, and back pain. States the new script is not making her dizzy anymore. Complains of stomach ache with metformin, but is fine when she takes half a tablet.     HPI Carrie Robertson 72 year old female, presents to the clinic for Hypertension follow up. She  has a past medical history of Hypertension and Type 2 diabetes mellitus (HCC).For the details of today's visit, please refer to assessment and plan.   HPI    Outpatient Encounter Medications as of 01/21/2023  Medication Sig   metFORMIN (GLUCOPHAGE) 500 MG tablet Take 1 tablet (500 mg total) by mouth daily with breakfast.   olmesartan-hydrochlorothiazide (BENICAR HCT) 40-12.5 MG tablet Take 1 tablet by mouth daily.   rosuvastatin (CRESTOR) 10 MG tablet Take 1 tablet (10 mg total) by mouth daily.   [DISCONTINUED] olmesartan-hydrochlorothiazide (BENICAR HCT) 40-25 MG tablet Take 1 tablet by mouth daily.   No facility-administered encounter medications on file as of 01/21/2023.    Past Surgical History:  Procedure Laterality Date   CESAREAN SECTION      Review of Systems  Constitutional:  Negative for chills and fever.  Eyes:  Negative for blurred vision.  Respiratory:  Negative for shortness of breath.   Cardiovascular:  Negative for chest pain.  Gastrointestinal:  Negative for abdominal pain.  Genitourinary:  Negative for dysuria.  Neurological:  Negative for dizziness and headaches.      Objective    BP 130/79   Pulse 86   Ht 5\' 1"  (1.549 m)   Wt 142 lb (64.4 kg)   SpO2 95%   BMI 26.83 kg/m   Physical Exam Vitals reviewed.  Constitutional:      General: She is not in acute distress.    Appearance: Normal appearance. She is not ill-appearing, toxic-appearing or diaphoretic.  HENT:     Head:  Normocephalic.  Eyes:     General:        Right eye: No discharge.        Left eye: No discharge.     Conjunctiva/sclera: Conjunctivae normal.  Cardiovascular:     Rate and Rhythm: Normal rate.     Pulses: Normal pulses.     Heart sounds: Normal heart sounds.  Pulmonary:     Effort: Pulmonary effort is normal. No respiratory distress.     Breath sounds: Normal breath sounds.  Musculoskeletal:        General: Normal range of motion.     Cervical back: Normal range of motion.  Skin:    General: Skin is warm and dry.     Capillary Refill: Capillary refill takes less than 2 seconds.  Neurological:     General: No focal deficit present.     Mental Status: She is alert and oriented to person, place, and time.     Coordination: Coordination normal.     Gait: Gait normal.  Psychiatric:        Mood and Affect: Mood normal.        Behavior: Behavior normal.       Assessment & Plan:  Screening for osteoporosis -     HM DEXA SCAN  Primary hypertension Assessment & Plan: Vitals:   01/21/23 1000 01/21/23 1005 01/21/23 1040  BP: (!) 172/76 (!) 152/78 130/79  Blood pressure controlled at home with readings ranging from: 130/73, 115/66, 110/63, 113/69, 102/62 Refilled Olmesartan-hydrochlorothiazide 40-12mg  once daily Continued discussion on DASH diet, low sodium diet and maintain a exercise routine for 150 minutes per week.    Other orders -     Olmesartan Medoxomil-HCTZ; Take 1 tablet by mouth daily.  Dispense: 90 tablet; Refill: 1    Return in about 3 months (around 04/23/2023), or if symptoms worsen or fail to improve, for hypertension, type 2 diabetes, routine labs.   Carrie Lederer Newman Nip, FNP

## 2023-01-21 NOTE — Patient Instructions (Signed)

## 2023-01-28 ENCOUNTER — Encounter: Payer: Self-pay | Admitting: *Deleted

## 2023-01-28 ENCOUNTER — Other Ambulatory Visit: Payer: Self-pay | Admitting: *Deleted

## 2023-01-28 DIAGNOSIS — Z1211 Encounter for screening for malignant neoplasm of colon: Secondary | ICD-10-CM

## 2023-01-28 MED ORDER — PEG 3350-KCL-NA BICARB-NACL 420 G PO SOLR: 4000.0000 mL | Freq: Once | ORAL | 0 refills | Status: AC

## 2023-01-28 NOTE — Telephone Encounter (Signed)
Pt has been scheduled 02/24/23 with Dr.Carver, instructions mailed to pt and prep sent to pharmacy.  Spoke with pt via interpreter line: Carrie Robertson # [phone] to schedule procedure.

## 2023-01-29 NOTE — Telephone Encounter (Signed)
Referral completed

## 2023-02-16 ENCOUNTER — Other Ambulatory Visit (HOSPITAL_COMMUNITY)
Admission: RE | Admit: 2023-02-16 | Discharge: 2023-02-16 | Disposition: A | Discharge status: Home / Self Care | Payer: Medicare Other | Source: Ambulatory Visit | Attending: Internal Medicine | Admitting: Internal Medicine

## 2023-02-16 DIAGNOSIS — Z1211 Encounter for screening for malignant neoplasm of colon: Secondary | ICD-10-CM | POA: Insufficient documentation

## 2023-02-16 LAB — BASIC METABOLIC PANEL
Anion gap: 8 (ref 5–15)
BUN: 16 mg/dL (ref 8–23)
CO2: 25 mmol/L (ref 22–32)
Calcium: 9 mg/dL (ref 8.9–10.3)
Chloride: 103 mmol/L (ref 98–111)
Creatinine, Ser: 0.72 mg/dL (ref 0.44–1.00)
GFR, Estimated: >60 mL/min (ref >60)
Glucose, Bld: 112 mg/dL — ABNORMAL HIGH (ref 70–99)
Potassium: 3.9 mmol/L (ref 3.5–5.1)
Sodium: 136 mmol/L (ref 135–145)

## 2023-02-24 ENCOUNTER — Ambulatory Visit (HOSPITAL_BASED_OUTPATIENT_CLINIC_OR_DEPARTMENT_OTHER): Payer: Medicare Other | Admitting: Certified Registered"

## 2023-02-24 ENCOUNTER — Encounter (HOSPITAL_COMMUNITY)
Admission: RE | Disposition: A | Discharge status: Home / Self Care | Payer: Self-pay | Source: Home / Self Care | Attending: Internal Medicine

## 2023-02-24 ENCOUNTER — Other Ambulatory Visit: Payer: Self-pay

## 2023-02-24 ENCOUNTER — Encounter (HOSPITAL_COMMUNITY): Payer: Self-pay

## 2023-02-24 ENCOUNTER — Ambulatory Visit (HOSPITAL_COMMUNITY): Payer: Medicare Other | Admitting: Certified Registered"

## 2023-02-24 ENCOUNTER — Ambulatory Visit (HOSPITAL_COMMUNITY)
Admission: RE | Admit: 2023-02-24 | Discharge: 2023-02-24 | Disposition: A | Discharge status: Home / Self Care | Payer: Medicare Other | Attending: Internal Medicine | Admitting: Internal Medicine

## 2023-02-24 DIAGNOSIS — Z1211 Encounter for screening for malignant neoplasm of colon: Secondary | ICD-10-CM | POA: Diagnosis not present

## 2023-02-24 DIAGNOSIS — K648 Other hemorrhoids: Secondary | ICD-10-CM | POA: Insufficient documentation

## 2023-02-24 DIAGNOSIS — K5730 Diverticulosis of large intestine without perforation or abscess without bleeding: Secondary | ICD-10-CM | POA: Insufficient documentation

## 2023-02-24 HISTORY — PX: COLONOSCOPY WITH PROPOFOL: SHX5780

## 2023-02-24 LAB — GLUCOSE, CAPILLARY: Glucose-Capillary: 95 mg/dL (ref 70–99)

## 2023-02-24 SURGERY — COLONOSCOPY WITH PROPOFOL
Anesthesia: General

## 2023-02-24 MED ORDER — LIDOCAINE HCL (CARDIAC) PF 100 MG/5ML IV SOSY
PREFILLED_SYRINGE | INTRAVENOUS | Status: DC | PRN
  Administered 2023-02-24: 80 mg via INTRAVENOUS

## 2023-02-24 MED ORDER — STERILE WATER FOR IRRIGATION IR SOLN
Status: DC | PRN
  Administered 2023-02-24: 60 mL

## 2023-02-24 MED ORDER — LACTATED RINGERS IV SOLN: INTRAVENOUS | Status: DC

## 2023-02-24 MED ORDER — PHENYLEPHRINE 80 MCG/ML (10ML) SYRINGE FOR IV PUSH (FOR BLOOD PRESSURE SUPPORT)
PREFILLED_SYRINGE | INTRAVENOUS | Status: AC
  Filled 2023-02-24: qty 10

## 2023-02-24 MED ORDER — PROPOFOL 500 MG/50ML IV EMUL
INTRAVENOUS | Status: DC | PRN
  Administered 2023-02-24: 150 ug/kg/min via INTRAVENOUS

## 2023-02-24 MED ORDER — PHENYLEPHRINE 80 MCG/ML (10ML) SYRINGE FOR IV PUSH (FOR BLOOD PRESSURE SUPPORT)
PREFILLED_SYRINGE | INTRAVENOUS | Status: DC | PRN
  Administered 2023-02-24 (×2): 160 ug via INTRAVENOUS

## 2023-02-24 MED ORDER — EPHEDRINE 5 MG/ML INJ
INTRAVENOUS | Status: AC
  Filled 2023-02-24: qty 5

## 2023-02-24 MED ORDER — PROPOFOL 10 MG/ML IV BOLUS
INTRAVENOUS | Status: DC | PRN
  Administered 2023-02-24: 100 mg via INTRAVENOUS

## 2023-02-24 NOTE — Anesthesia Preprocedure Evaluation (Signed)
Anesthesia Evaluation  Patient identified by MRN, date of birth, ID band Patient awake    Reviewed: Allergy & Precautions, H&P , NPO status , Patient's Chart, lab work & pertinent test results, reviewed documented beta blocker date and time   Airway Mallampati: II  TM Distance: >3 FB Neck ROM: full    Dental no notable dental hx.    Pulmonary neg pulmonary ROS   Pulmonary exam normal breath sounds clear to auscultation       Cardiovascular Exercise Tolerance: Good hypertension, negative cardio ROS  Rhythm:regular Rate:Normal     Neuro/Psych negative neurological ROS  negative psych ROS   GI/Hepatic negative GI ROS, Neg liver ROS,,,  Endo/Other  negative endocrine ROSdiabetes    Renal/GU negative Renal ROS  negative genitourinary   Musculoskeletal   Abdominal   Peds  Hematology negative hematology ROS (+)   Anesthesia Other Findings   Reproductive/Obstetrics negative OB ROS                             Anesthesia Physical Anesthesia Plan  ASA: 2  Anesthesia Plan: General   Post-op Pain Management:    Induction:   PONV Risk Score and Plan: Propofol infusion  Airway Management Planned:   Additional Equipment:   Intra-op Plan:   Post-operative Plan:   Informed Consent: I have reviewed the patients History and Physical, chart, labs and discussed the procedure including the risks, benefits and alternatives for the proposed anesthesia with the patient or authorized representative who has indicated his/her understanding and acceptance.     Dental Advisory Given  Plan Discussed with: CRNA  Anesthesia Plan Comments:        Anesthesia Quick Evaluation  

## 2023-02-24 NOTE — Discharge Instructions (Addendum)
Su colonoscopia fue normal.  No encontr ningn plipo ni evidencia de cncer de colon.  Tienes diverticulosis y hemorroides internas. Recomendara aumentar la fibra en su dieta o agregar Benefiber/Metamucil de [employer].  Dr. Marletta Lor

## 2023-02-24 NOTE — Transfer of Care (Signed)
Immediate Anesthesia Transfer of Care Note  Patient: Carrie Robertson  Procedure(s) Performed: COLONOSCOPY WITH PROPOFOL  Patient Location: Short Stay  Anesthesia Type:General  Level of Consciousness: drowsy and patient cooperative  Airway & Oxygen Therapy: Patient Spontanous Breathing and Patient connected to nasal cannula oxygen  Post-op Assessment: Report given to RN and Post -op Vital signs reviewed and stable  Post vital signs: Reviewed and stable  Last Vitals:  Vitals Value Taken Time  BP 124/54 02/24/23 1232  Temp 36.5 C 02/24/23 1232  Pulse 79 02/24/23 1232  Resp 16 02/24/23 1232  SpO2 99 % 02/24/23 1232    Last Pain:  Vitals:   02/24/23 1232  TempSrc: Oral  PainSc: 0-No pain         Complications: No notable events documented.

## 2023-02-24 NOTE — H&P (Signed)
Primary Care Physician:  Rica Records, FNP Primary Gastroenterologist:  Dr. Marletta Lor  Pre-Procedure History & Physical: HPI:  Carrie Robertson is a 72 y.o. female is here for first ever colonoscopy for colon cancer screening purposes.  Patient denies any family history of colorectal cancer.  No melena or hematochezia.  No abdominal pain or unintentional weight loss.  No change in bowel habits.  Overall feels well from a GI standpoint.  Past Medical History:  Diagnosis Date   Hyperlipidemia    Hypertension    Type 2 diabetes mellitus (HCC)     Past Surgical History:  Procedure Laterality Date   CESAREAN SECTION      Prior to Admission medications   Medication Sig Start Date End Date Taking? Authorizing Provider  metFORMIN (GLUCOPHAGE) 500 MG tablet Take 1 tablet (500 mg total) by mouth daily with breakfast. 12/24/22   Del Newman Nip, Tenna Child, FNP  olmesartan-hydrochlorothiazide (BENICAR HCT) 40-12.5 MG tablet Take 1 tablet by mouth daily. 01/21/23   Del Nigel Berthold, FNP  rosuvastatin (CRESTOR) 10 MG tablet Take 1 tablet (10 mg total) by mouth daily. 12/20/22   Del Nigel Berthold, FNP    Allergies as of 01/28/2023   (No Known Allergies)    Family History  Problem Relation Age of Onset   Parkinson's disease Father    Osteoporosis Mother    Cancer Brother     Social History   Socioeconomic History   Marital status: Married    Spouse name: Not on file   Number of children: Not on file   Years of education: Not on file   Highest education level: Not on file  Occupational History   Not on file  Tobacco Use   Smoking status: Never   Smokeless tobacco: Never  Vaping Use   Vaping status: Never Used  Substance and Sexual Activity   Alcohol use: Not Currently   Drug use: Never   Sexual activity: Not Currently    Birth control/protection: Post-menopausal  Other Topics Concern   Not on file  Social History Narrative   Not on file   Social  Determinants of Health   Financial Resource Strain: Not on file  Food Insecurity: Not on file  Transportation Needs: Not on file  Physical Activity: Not on file  Stress: Not on file  Social Connections: Not on file  Intimate Partner Violence: Not on file    Review of Systems: See HPI, otherwise negative ROS  Physical Exam: Vital signs in last 24 hours:     General:   Alert,  Well-developed, well-nourished, pleasant and cooperative in NAD Head:  Normocephalic and atraumatic. Eyes:  Sclera clear, no icterus.   Conjunctiva pink. Ears:  Normal auditory acuity. Nose:  No deformity, discharge,  or lesions. Msk:  Symmetrical without gross deformities. Normal posture. Extremities:  Without clubbing or edema. Neurologic:  Alert and  oriented x4;  grossly normal neurologically. Skin:  Intact without significant lesions or rashes. Psych:  Alert and cooperative. Normal mood and affect.  Impression/Plan: Carrie Robertson is here for a colonoscopy to be performed for colon cancer screening purposes.  The risks of the procedure including infection, bleed, or perforation as well as benefits, limitations, alternatives and imponderables have been reviewed with the patient. Questions have been answered. All parties agreeable.

## 2023-02-24 NOTE — Op Note (Signed)
[facility] Patient Name: Carrie Robertson Procedure Date: 02/24/2023 11:19 AM MRN: [id number] Date of Birth: September 09, 1950 Attending MD: Hennie Duos. Marletta Lor , [place], [id number] CSN: [id number] Age: 72 Admit Type: Outpatient Procedure:                Colonoscopy Indications:              Screening for colorectal malignant neoplasm Providers:                Hennie Duos. Marletta Lor, DO, Angelica Ran, Zena Amos Referring MD:              Medicines:                See the Anesthesia note for documentation of the                            administered medications Complications:            No immediate complications. Estimated Blood Loss:     Estimated blood loss: none. Procedure:                Pre-Anesthesia Assessment:                           - The anesthesia plan was to use monitored                            anesthesia care (MAC).                           After obtaining informed consent, the colonoscope                            was passed under direct vision. Throughout the                            procedure, the patient's blood pressure, pulse, and                            oxygen saturations were monitored continuously. The                            [id number]-HQ190L ([id number]) scope was introduced through                            the anus and advanced to the the cecum, identified                            by appendiceal orifice and ileocecal valve. The                            colonoscopy was performed without difficulty. The                            patient tolerated the procedure well. The quality                            of  the bowel preparation was evaluated using the                            BBPS [facility] Bowel Preparation Scale) with scores                            of: Right Colon = 3, Transverse Colon = 3 and Left                            Colon = 3 (entire mucosa seen well with no residual                            staining, small fragments of stool or opaque                             liquid). The total BBPS score equals 9. Scope In: 12:14:57 PM Scope Out: 12:27:13 PM Scope Withdrawal Time: 0 hours 9 minutes 38 seconds  Total Procedure Duration: 0 hours 12 minutes 16 seconds  Findings:      Non-bleeding internal hemorrhoids were found during endoscopy.      Multiple medium-mouthed and small-mouthed diverticula were found in the       sigmoid colon and ascending colon.      The exam was otherwise without abnormality. Impression:               - Non-bleeding internal hemorrhoids.                           - Diverticulosis in the sigmoid colon and in the                            ascending colon.                           - The examination was otherwise normal.                           - No specimens collected. Moderate Sedation:      Per Anesthesia Care Recommendation:           - Patient has a contact number available for                            emergencies. The signs and symptoms of potential                            delayed complications were discussed with the                            patient. Return to normal activities tomorrow.                            Written discharge instructions were provided to the                            patient.                           -  Resume previous diet.                           - Continue present medications.                           - No repeat colonoscopy due to age.                           - Return to GI clinic PRN. Procedure Code(s):        --- Professional ---                           Z6109, Colorectal cancer screening; colonoscopy on                            individual not meeting criteria for high risk Diagnosis Code(s):        --- Professional ---                           Z12.11, Encounter for screening for malignant                            neoplasm of colon                           K64.8, Other hemorrhoids                           K57.30, Diverticulosis of large  intestine without                            perforation or abscess without bleeding CPT copyright 2022 American Medical Association. All rights reserved. The codes documented in this report are preliminary and upon coder review may  be revised to meet current compliance requirements. Hennie Duos. Marletta Lor, DO Hennie Duos. Marletta Lor, DO 02/24/2023 12:32:00 PM This report has been signed electronically. Number of Addenda: 0

## 2023-02-25 NOTE — Anesthesia Postprocedure Evaluation (Signed)
Anesthesia Post Note  Patient: Carrie Robertson  Procedure(s) Performed: COLONOSCOPY WITH PROPOFOL  Patient location during evaluation: Phase II Anesthesia Type: General Level of consciousness: awake Pain management: pain level controlled Vital Signs Assessment: post-procedure vital signs reviewed and stable Respiratory status: spontaneous breathing and respiratory function stable Cardiovascular status: blood pressure returned to baseline and stable Postop Assessment: no headache and no apparent nausea or vomiting Anesthetic complications: no Comments: Late entry   No notable events documented.   Last Vitals:  Vitals:   02/24/23 1132 02/24/23 1232  BP: (!) 146/61 (!) 124/54  Pulse: 76 79  Resp: 16 16  Temp: 36.9 C 36.5 C  SpO2: 98% 99%    Last Pain:  Vitals:   02/24/23 1232  TempSrc: Oral  PainSc: 0-No pain                 Windell Norfolk

## 2023-02-26 ENCOUNTER — Encounter (HOSPITAL_COMMUNITY): Payer: Self-pay | Admitting: Internal Medicine

## 2023-04-23 ENCOUNTER — Ambulatory Visit: Payer: Medicare Other | Admitting: Family Medicine

## 2023-09-10 ENCOUNTER — Ambulatory Visit: Payer: Medicare Other | Admitting: Family Medicine

## 2023-09-10 ENCOUNTER — Encounter: Payer: Self-pay | Admitting: Family Medicine

## 2023-09-10 VITALS — BP 137/79 | HR 77 | Ht 61.0 in | Wt 133.0 lb

## 2023-09-10 DIAGNOSIS — Z1231 Encounter for screening mammogram for malignant neoplasm of breast: Secondary | ICD-10-CM

## 2023-09-10 DIAGNOSIS — Z7984 Long term (current) use of oral hypoglycemic drugs: Secondary | ICD-10-CM | POA: Diagnosis not present

## 2023-09-10 DIAGNOSIS — E119 Type 2 diabetes mellitus without complications: Secondary | ICD-10-CM | POA: Insufficient documentation

## 2023-09-10 DIAGNOSIS — E1169 Type 2 diabetes mellitus with other specified complication: Secondary | ICD-10-CM | POA: Diagnosis not present

## 2023-09-10 DIAGNOSIS — I10 Essential (primary) hypertension: Secondary | ICD-10-CM

## 2023-09-10 MED ORDER — METFORMIN HCL 500 MG PO TABS: 500.0000 mg | ORAL_TABLET | Freq: Every day | ORAL | 1 refills | Status: AC

## 2023-09-10 MED ORDER — OLMESARTAN MEDOXOMIL-HCTZ 40-12.5 MG PO TABS: 1.0000 | ORAL_TABLET | Freq: Every day | ORAL | 2 refills | Status: DC

## 2023-09-10 MED ORDER — METFORMIN HCL 500 MG PO TABS: 500.0000 mg | ORAL_TABLET | Freq: Every day | ORAL | 1 refills | Status: DC

## 2023-09-10 MED ORDER — OLMESARTAN MEDOXOMIL-HCTZ 40-12.5 MG PO TABS: 1.0000 | ORAL_TABLET | Freq: Every day | ORAL | 1 refills | Status: DC

## 2023-09-10 NOTE — Assessment & Plan Note (Signed)
 Last Hemoglobin A1c: 6.5 Labs: Ordered today, results pending; will follow up accordingly. The patient reports adhering to prescribed medications:  Metformin 500 mg once daily Reviewed non-pharmacological interventions, including a balanced diet rich in lean proteins, healthy fats, whole grains, and high-fiber vegetables. Emphasized reducing refined sugars and processed carbohydrates, and incorporating more fruits, leafy greens, and legumes. Education: Patient was educated on recognizing signs and symptoms of both hypoglycemia and hyperglycemia, and advised to seek emergency care if these symptoms occur. Follow-Up: Scheduled for follow-up in 3-4 months, or sooner if needed. Patient Understanding: The patient verbalized understanding of the care plan, and all questions were answered. Additional Care: Ophthalmology referral was placed. Foot exam results were within normal limits.

## 2023-09-10 NOTE — Progress Notes (Signed)
 Established Patient Office Visit   Subjective  Patient ID: Carrie Robertson, female    DOB: 04-29-1951  Age: 73 y.o. MRN: [id number]  Chief Complaint  Patient presents with   Care Management    Follow up for htn and type 2 diabetes, had blood work done in [place] in November.     She  has a past medical history of Hyperlipidemia, Hypertension, and Type 2 diabetes mellitus (HCC).  HPI Patient presents to the clinic for chronic follow up. For the details of today's visit, please refer to assessment and plan.   Review of Systems  Constitutional:  Negative for chills and fever.  Eyes:  Negative for blurred vision.  Respiratory:  Negative for shortness of breath.   Cardiovascular:  Negative for chest pain.  Gastrointestinal:  Negative for abdominal pain.  Genitourinary:  Negative for dysuria.  Neurological:  Negative for dizziness and headaches.      Objective:     BP 137/79 (BP Location: Left Arm)   Pulse 77   Ht 5\' 1"  (1.549 m)   Wt 133 lb 0.6 oz (60.3 kg)   SpO2 96%   BMI 25.14 kg/m  BP Readings from Last 3 Encounters:  09/10/23 137/79  02/24/23 (!) 124/54  01/21/23 130/79      Physical Exam Vitals reviewed.  Constitutional:      General: She is not in acute distress.    Appearance: Normal appearance. She is not ill-appearing, toxic-appearing or diaphoretic.  HENT:     Head: Normocephalic.     Right Ear: Tympanic membrane normal.     Left Ear: Tympanic membrane normal.     Nose: Nose normal.     Mouth/Throat:     Mouth: Mucous membranes are moist.  Eyes:     General:        Right eye: No discharge.        Left eye: No discharge.     Conjunctiva/sclera: Conjunctivae normal.     Pupils: Pupils are equal, round, and reactive to light.  Cardiovascular:     Rate and Rhythm: Normal rate.     Pulses: Normal pulses.     Heart sounds: Normal heart sounds.  Pulmonary:     Effort: Pulmonary effort is normal. No respiratory distress.     Breath sounds: Normal  breath sounds.  Abdominal:     General: Bowel sounds are normal.     Palpations: Abdomen is soft.     Tenderness: There is no abdominal tenderness. There is no right CVA tenderness, left CVA tenderness or guarding.  Musculoskeletal:        General: Normal range of motion.     Cervical back: Normal range of motion.  Skin:    General: Skin is warm and dry.     Capillary Refill: Capillary refill takes less than 2 seconds.  Neurological:     Mental Status: She is alert.     Coordination: Coordination normal.     Gait: Gait normal.  Psychiatric:        Mood and Affect: Mood normal.        Behavior: Behavior normal.      No results found for any visits on 09/10/23.  The 10-year ASCVD risk score (Arnett DK, et al., 2019) is: 29%    Assessment & Plan:  Primary hypertension Assessment & Plan: Continue Olmesartan-hydrochlorothiazide 40-12mg  once daily  Labs ordered. Discussed with  patient to monitor their blood pressure regularly and maintain a heart-healthy diet rich  in fruits, vegetables, whole grains, and low-fat dairy, while reducing sodium intake to less than 2,300 mg per day. Regular physical activity, such as 30 minutes of moderate exercise most days of the week, will help lower blood pressure and improve overall cardiovascular health. Avoiding smoking, limiting alcohol consumption, and managing stress. Take  prescribed medication, & take it as directed and avoid skipping doses. Seek emergency care if your blood pressure is (over 180/100) or you experience chest pain, shortness of breath, or sudden vision changes.Patient verbalizes understanding regarding plan of care and all questions answered.   Orders: -     BMP8+eGFR -     CBC with Differential/Platelet -     Lipid panel  Type 2 diabetes mellitus without complication, without long-term current use of insulin (HCC) Assessment & Plan: Last Hemoglobin A1c: 6.5 Labs: Ordered today, results pending; will follow up  accordingly. The patient reports adhering to prescribed medications: Metformin 500 mg once daily  Reviewed non-pharmacological interventions, including a balanced diet rich in lean proteins, healthy fats, whole grains, and high-fiber vegetables. Emphasized reducing refined sugars and processed carbohydrates, and incorporating more fruits, leafy greens, and legumes. Education: Patient was educated on recognizing signs and symptoms of both hypoglycemia and hyperglycemia, and advised to seek emergency care if these symptoms occur. Follow-Up: Scheduled for follow-up in 3-4 months, or sooner if needed. Patient Understanding: The patient verbalized understanding of the care plan, and all questions were answered. Additional Care: Ophthalmology referral was placed. Foot exam results were within normal limits.   Orders: -     Hemoglobin A1c  Breast cancer screening by mammogram -     3D Screening Mammogram, Left and Right; Future  Other orders -     metFORMIN HCl; Take 1 tablet (500 mg total) by mouth daily with breakfast.  Dispense: 90 tablet; Refill: 1 -     Olmesartan Medoxomil-HCTZ; Take 1 tablet by mouth daily.  Dispense: 30 tablet; Refill: 2    Return in about 6 months (around 03/12/2024), or if symptoms worsen or fail to improve, for [employer]: Please schedule Mammogram, hypertension, hyperlipidemia, type 2 diabetes.   Cruzita Lederer Newman Nip, FNP

## 2023-09-10 NOTE — Patient Instructions (Signed)

## 2023-09-10 NOTE — Assessment & Plan Note (Signed)
 Continue Olmesartan-hydrochlorothiazide 40-12mg  once daily  Labs ordered. Discussed with  patient to monitor their blood pressure regularly and maintain a heart-healthy diet rich in fruits, vegetables, whole grains, and low-fat dairy, while reducing sodium intake to less than 2,300 mg per day. Regular physical activity, such as 30 minutes of moderate exercise most days of the week, will help lower blood pressure and improve overall cardiovascular health. Avoiding smoking, limiting alcohol consumption, and managing stress. Take  prescribed medication, & take it as directed and avoid skipping doses. Seek emergency care if your blood pressure is (over 180/100) or you experience chest pain, shortness of breath, or sudden vision changes.Patient verbalizes understanding regarding plan of care and all questions answered.

## 2023-09-11 ENCOUNTER — Other Ambulatory Visit: Payer: Self-pay | Admitting: Family Medicine

## 2023-09-11 LAB — CBC WITH DIFFERENTIAL/PLATELET
Basophils Absolute: 0 10*3/uL (ref 0.0–0.2)
Basos: 0 %
EOS (ABSOLUTE): 0.1 10*3/uL (ref 0.0–0.4)
Eos: 1 %
Hematocrit: 42.2 % (ref 34.0–46.6)
Hemoglobin: 13.7 g/dL (ref 11.1–15.9)
Immature Grans (Abs): 0 10*3/uL (ref 0.0–0.1)
Immature Granulocytes: 0 %
Lymphocytes Absolute: 2.5 10*3/uL (ref 0.7–3.1)
Lymphs: 30 %
MCH: 29.1 pg (ref 26.6–33.0)
MCHC: 32.5 g/dL (ref 31.5–35.7)
MCV: 90 fL (ref 79–97)
Monocytes Absolute: 0.5 10*3/uL (ref 0.1–0.9)
Monocytes: 6 %
Neutrophils Absolute: 5.1 10*3/uL (ref 1.4–7.0)
Neutrophils: 63 %
Platelets: 306 10*3/uL (ref 150–450)
RBC: 4.71 x10E6/uL (ref 3.77–5.28)
RDW: 12.7 % (ref 11.7–15.4)
WBC: 8.2 10*3/uL (ref 3.4–10.8)

## 2023-09-11 LAB — BMP8+EGFR
BUN/Creatinine Ratio: 17 (ref 12–28)
BUN: 13 mg/dL (ref 8–27)
CO2: 25 mmol/L (ref 20–29)
Calcium: 9.9 mg/dL (ref 8.7–10.3)
Chloride: 102 mmol/L (ref 96–106)
Creatinine, Ser: 0.77 mg/dL (ref 0.57–1.00)
Glucose: 94 mg/dL (ref 70–99)
Potassium: 5.1 mmol/L (ref 3.5–5.2)
Sodium: 141 mmol/L (ref 134–144)
eGFR: 82 mL/min/{1.73_m2} (ref >59)

## 2023-09-11 LAB — LIPID PANEL
Chol/HDL Ratio: 2.2 ratio (ref 0.0–4.4)
Cholesterol, Total: 78 mg/dL — ABNORMAL LOW (ref 100–199)
HDL: 35 mg/dL — ABNORMAL LOW (ref >39)
LDL Chol Calc (NIH): 19 mg/dL (ref 0–99)
Triglycerides: 135 mg/dL (ref 0–149)
VLDL Cholesterol Cal: 24 mg/dL (ref 5–40)

## 2023-09-11 LAB — HEMOGLOBIN A1C
Est. average glucose Bld gHb Est-mCnc: 131 mg/dL
Hgb A1c MFr Bld: 6.2 % — ABNORMAL HIGH (ref 4.8–5.6)

## 2023-09-11 MED ORDER — ROSUVASTATIN CALCIUM 10 MG PO TABS: 10.0000 mg | ORAL_TABLET | Freq: Every day | ORAL | 3 refills | Status: DC

## 2023-09-11 NOTE — Progress Notes (Signed)
 Hi Carrie Robertson, spanish speaking patient:  Please inform patient, Cholesterol levels has improved- start taking Crestor 10 mg instead of 20 mg.  Hemoglobin A1c levels has improved to 6.2 continue with Metformin 500 mg once daily.  Kidney function normal.

## 2023-09-14 ENCOUNTER — Encounter (HOSPITAL_COMMUNITY): Payer: Self-pay

## 2023-09-14 ENCOUNTER — Ambulatory Visit (HOSPITAL_COMMUNITY)
Admission: RE | Admit: 2023-09-14 | Discharge: 2023-09-14 | Disposition: A | Discharge status: Home / Self Care | Source: Ambulatory Visit | Attending: Family Medicine | Admitting: Family Medicine

## 2023-09-14 DIAGNOSIS — Z1231 Encounter for screening mammogram for malignant neoplasm of breast: Secondary | ICD-10-CM | POA: Insufficient documentation

## 2023-12-16 ENCOUNTER — Telehealth: Payer: Self-pay | Admitting: Family Medicine

## 2023-12-16 NOTE — Telephone Encounter (Signed)
 Called patient left a voicemail to reach our office to schedule a diabetic eye exam.

## 2024-03-16 ENCOUNTER — Telehealth: Admitting: Family Medicine

## 2024-03-16 VITALS — BP 132/67 | HR 75 | Ht 61.0 in | Wt 137.8 lb

## 2024-03-16 DIAGNOSIS — Z7984 Long term (current) use of oral hypoglycemic drugs: Secondary | ICD-10-CM

## 2024-03-16 DIAGNOSIS — E038 Other specified hypothyroidism: Secondary | ICD-10-CM

## 2024-03-16 DIAGNOSIS — E559 Vitamin D deficiency, unspecified: Secondary | ICD-10-CM

## 2024-03-16 DIAGNOSIS — E039 Hypothyroidism, unspecified: Secondary | ICD-10-CM | POA: Diagnosis not present

## 2024-03-16 DIAGNOSIS — E119 Type 2 diabetes mellitus without complications: Secondary | ICD-10-CM | POA: Diagnosis not present

## 2024-03-16 DIAGNOSIS — I10 Essential (primary) hypertension: Secondary | ICD-10-CM | POA: Diagnosis not present

## 2024-03-16 MED ORDER — OLMESARTAN MEDOXOMIL-HCTZ 40-12.5 MG PO TABS: 1.0000 | ORAL_TABLET | Freq: Every day | ORAL | 3 refills | Status: DC

## 2024-03-16 NOTE — Assessment & Plan Note (Signed)
 Last Hemoglobin A1c: 6.2 Labs: Ordered today, results pending; will follow up accordingly. The patient reports adhering to prescribed medications: Metformin  500 mg once daily   Reviewed non-pharmacological interventions, including a balanced diet rich in lean proteins, healthy fats, whole grains, and high-fiber vegetables. Emphasized reducing refined sugars and processed carbohydrates, and incorporating more fruits, leafy greens, and legumes. Education: Patient was educated on recognizing signs and symptoms of both hypoglycemia and hyperglycemia, and advised to seek emergency care if these symptoms occur. Follow-Up: Scheduled for follow-up in 3-4 months, or sooner if needed. Patient Understanding: The patient verbalized understanding of the care plan, and all questions were answered. Additional Care: Ophthalmology referral was placed.

## 2024-03-16 NOTE — Assessment & Plan Note (Signed)
 Continue Olmesartan -hydrochlorothiazide 40-12mg  once daily  Labs ordered Continued discussion on DASH diet, low sodium diet and maintain a exercise routine for 150 minutes per week.

## 2024-03-16 NOTE — Progress Notes (Signed)
 Virtual Visit via Video Note  I connected with Carrie Robertson on 03/16/24 at  9:40 AM EDT by a video enabled telemedicine application and verified that I am speaking with the correct person using two identifiers.  Patient Location: Home Provider Location: Office/Clinic  I discussed the limitations, risks, security, and privacy concerns of performing an evaluation and management service by video and the availability of in person appointments. I also discussed with the patient that there may be a patient responsible charge related to this service. The patient expressed understanding and agreed to proceed.  Subjective: PCP: Terry Wilhelmena Lloyd Hilario, FNP  No chief complaint on file.  HPI  She presents for a follow-up visit for type 2 diabetes mellitus. She denies dizziness, speech difficulties, tremors, chest pain, foot ulcerations, visual changes, blackouts, or peripheral neuropathy. She does report fatigue. Risk factors for coronary artery disease include diabetes mellitus and dyslipidemia. Her diabetes is currently managed with oral monotherapy, and she reports full compliance with her treatment plan. She participates in exercise intermittently.  ROS: Per HPI  Current Outpatient Medications:    ACETYLCYSTEINE PO, Take 100 mg by mouth. Prescribed in [place], Disp: , Rfl:    metFORMIN  (GLUCOPHAGE ) 500 MG tablet, Take 1 tablet (500 mg total) by mouth daily with breakfast., Disp: 90 tablet, Rfl: 1   olmesartan -hydrochlorothiazide (BENICAR  HCT) 40-12.5 MG tablet, Take 1 tablet by mouth daily., Disp: 30 tablet, Rfl: 3   rosuvastatin  (CRESTOR ) 10 MG tablet, Take 1 tablet (10 mg total) by mouth daily., Disp: 90 tablet, Rfl: 3  Observations/Objective: Today's Vitals   03/16/24 1009  BP: 132/67  Pulse: 75  SpO2: 98%  Weight: 137 lb 12.8 oz (62.5 kg)  Height: 5' 1 (1.549 m)  PainSc: 0-No pain   Physical Exam Patient is alert and no acute distress noted.   Assessment and Plan: Type  2 diabetes mellitus without complication, without long-term current use of insulin (HCC) Assessment & Plan: Last Hemoglobin A1c: 6.2 Labs: Ordered today, results pending; will follow up accordingly. The patient reports adhering to prescribed medications: Metformin  500 mg once daily   Reviewed non-pharmacological interventions, including a balanced diet rich in lean proteins, healthy fats, whole grains, and high-fiber vegetables. Emphasized reducing refined sugars and processed carbohydrates, and incorporating more fruits, leafy greens, and legumes. Education: Patient was educated on recognizing signs and symptoms of both hypoglycemia and hyperglycemia, and advised to seek emergency care if these symptoms occur. Follow-Up: Scheduled for follow-up in 3-4 months, or sooner if needed. Patient Understanding: The patient verbalized understanding of the care plan, and all questions were answered. Additional Care: Ophthalmology referral was placed.   Orders: -     Hemoglobin A1c -     Microalbumin / creatinine urine ratio -     Ambulatory referral to Ophthalmology  Primary hypertension Assessment & Plan: Continue Olmesartan -hydrochlorothiazide 40-12mg  once daily  Labs ordered Continued discussion on DASH diet, low sodium diet and maintain a exercise routine for 150 minutes per week.   Orders: -     Lipid panel -     BMP8+eGFR -     CBC with Differential/Platelet  TSH (thyroid-stimulating hormone deficiency) -     TSH + free T4  Vitamin D  deficiency -     VITAMIN D  25 Hydroxy (Vit-D Deficiency, Fractures)  Other orders -     Olmesartan  Medoxomil-HCTZ; Take 1 tablet by mouth daily.  Dispense: 30 tablet; Refill: 3    Follow Up Instructions: No follow-ups on file.  I discussed the assessment and treatment plan with the patient. The patient was provided an opportunity to ask questions, and all were answered. The patient agreed with the plan and demonstrated an understanding of the  instructions.   The patient was advised to call back or seek an in-person evaluation if the symptoms worsen or if the condition fails to improve as anticipated.  The above assessment and management plan was discussed with the patient. The patient verbalized understanding of and has agreed to the management plan.   Carrie Kidd Wilhelmena Falter, FNP

## 2024-03-17 ENCOUNTER — Other Ambulatory Visit: Payer: Self-pay | Admitting: Family Medicine

## 2024-03-17 ENCOUNTER — Ambulatory Visit: Payer: Self-pay | Admitting: Family Medicine

## 2024-03-17 LAB — CBC WITH DIFFERENTIAL/PLATELET
Basophils Absolute: 0 x10E3/uL (ref 0.0–0.2)
Basos: 0 %
EOS (ABSOLUTE): 0.1 x10E3/uL (ref 0.0–0.4)
Eos: 1 %
Hematocrit: 43 % (ref 34.0–46.6)
Hemoglobin: 13.6 g/dL (ref 11.1–15.9)
Immature Grans (Abs): 0 x10E3/uL (ref 0.0–0.1)
Immature Granulocytes: 0 %
Lymphocytes Absolute: 2.9 x10E3/uL (ref 0.7–3.1)
Lymphs: 35 %
MCH: 29.4 pg (ref 26.6–33.0)
MCHC: 31.6 g/dL (ref 31.5–35.7)
MCV: 93 fL (ref 79–97)
Monocytes Absolute: 0.5 x10E3/uL (ref 0.1–0.9)
Monocytes: 6 %
Neutrophils Absolute: 4.9 x10E3/uL (ref 1.4–7.0)
Neutrophils: 58 %
Platelets: 335 x10E3/uL (ref 150–450)
RBC: 4.62 x10E6/uL (ref 3.77–5.28)
RDW: 12.5 % (ref 11.7–15.4)
WBC: 8.4 x10E3/uL (ref 3.4–10.8)

## 2024-03-17 LAB — BMP8+EGFR
BUN/Creatinine Ratio: 28 (ref 12–28)
BUN: 21 mg/dL (ref 8–27)
CO2: 27 mmol/L (ref 20–29)
Calcium: 10.2 mg/dL (ref 8.7–10.3)
Chloride: 100 mmol/L (ref 96–106)
Creatinine, Ser: 0.76 mg/dL (ref 0.57–1.00)
Glucose: 101 mg/dL — ABNORMAL HIGH (ref 70–99)
Potassium: 4.9 mmol/L (ref 3.5–5.2)
Sodium: 141 mmol/L (ref 134–144)
eGFR: 83 mL/min/1.73 (ref >59)

## 2024-03-17 LAB — LIPID PANEL
Chol/HDL Ratio: 5.7 ratio — ABNORMAL HIGH (ref 0.0–4.4)
Cholesterol, Total: 187 mg/dL (ref 100–199)
HDL: 33 mg/dL — ABNORMAL LOW (ref >39)
LDL Chol Calc (NIH): 111 mg/dL — ABNORMAL HIGH (ref 0–99)
Triglycerides: 245 mg/dL — ABNORMAL HIGH (ref 0–149)
VLDL Cholesterol Cal: 43 mg/dL — ABNORMAL HIGH (ref 5–40)

## 2024-03-17 LAB — HEMOGLOBIN A1C
Est. average glucose Bld gHb Est-mCnc: 137 mg/dL
Hgb A1c MFr Bld: 6.4 % — ABNORMAL HIGH (ref 4.8–5.6)

## 2024-03-17 LAB — TSH+FREE T4
Free T4: 1.27 ng/dL (ref 0.82–1.77)
TSH: 1.74 u[IU]/mL (ref 0.450–4.500)

## 2024-03-17 LAB — VITAMIN D 25 HYDROXY (VIT D DEFICIENCY, FRACTURES): Vit D, 25-Hydroxy: 33.2 ng/mL (ref 30.0–100.0)

## 2024-03-17 MED ORDER — ROSUVASTATIN CALCIUM 20 MG PO TABS: 20.0000 mg | ORAL_TABLET | Freq: Every day | ORAL | 3 refills | Status: AC

## 2024-03-17 MED ORDER — OMEGA-3-ACID ETHYL ESTERS 1 G PO CAPS
1.0000 g | ORAL_CAPSULE | Freq: Two times a day (BID) | ORAL | 3 refills | Status: AC
Start: 2024-03-17 — End: ?

## 2024-03-17 NOTE — Progress Notes (Signed)
 Spanish speaking patient,  Please inform the patient:  Her hemoglobin A1c is 6.4, indicating her type 2 diabetes is well controlled. She should continue taking metformin  as prescribed.  Her cholesterol and triglyceride levels have increased compared to previous results.  Plan: Her Crestor  dose was increased to 20 mg once daily, and  I started her on Lovaza  1 g twice daily was prescribed to help lower triglycerides.  Kidney and thyroid function panels are normal.

## 2024-04-16 ENCOUNTER — Other Ambulatory Visit: Payer: Self-pay | Admitting: Family Medicine

## 2024-05-11 ENCOUNTER — Other Ambulatory Visit: Payer: Self-pay | Admitting: Family Medicine
